# Patient Record
Sex: Female | Born: 1965 | Race: White | Hispanic: No | Marital: Single | Smoking: Never smoker
Health system: Southern US, Community
[De-identification: ages and names within clinical notes are randomized; demographics above are authoritative.]

## PROBLEM LIST (undated history)

## (undated) HISTORY — PX: TUBAL LIGATION: SHX77

## (undated) HISTORY — PX: FOOT ARTHROPLASTY: SHX1657

## (undated) HISTORY — PX: OTHER SURGICAL HISTORY: SHX169

---

## 2003-09-06 ENCOUNTER — Ambulatory Visit (HOSPITAL_COMMUNITY): Admission: RE | Admit: 2003-09-06 | Discharge: 2003-09-06 | Payer: Self-pay | Admitting: Internal Medicine

## 2003-10-14 ENCOUNTER — Ambulatory Visit (HOSPITAL_COMMUNITY): Admission: RE | Admit: 2003-10-14 | Discharge: 2003-10-14 | Payer: Self-pay | Admitting: Family Medicine

## 2003-10-29 ENCOUNTER — Emergency Department (HOSPITAL_COMMUNITY): Admission: EM | Admit: 2003-10-29 | Discharge: 2003-10-30 | Payer: Self-pay | Admitting: Emergency Medicine

## 2004-03-20 ENCOUNTER — Ambulatory Visit: Payer: Self-pay | Admitting: Family Medicine

## 2004-03-30 ENCOUNTER — Ambulatory Visit: Payer: Self-pay | Admitting: Family Medicine

## 2004-04-03 ENCOUNTER — Ambulatory Visit: Payer: Self-pay | Admitting: Family Medicine

## 2004-04-06 ENCOUNTER — Ambulatory Visit: Payer: Self-pay | Admitting: *Deleted

## 2004-04-09 ENCOUNTER — Encounter: Admission: RE | Admit: 2004-04-09 | Discharge: 2004-04-09 | Payer: Self-pay | Admitting: Family Medicine

## 2004-05-04 ENCOUNTER — Ambulatory Visit: Payer: Self-pay | Admitting: Family Medicine

## 2004-10-19 ENCOUNTER — Ambulatory Visit: Payer: Self-pay | Admitting: Family Medicine

## 2005-03-26 ENCOUNTER — Ambulatory Visit: Payer: Self-pay | Admitting: Nurse Practitioner

## 2005-04-05 ENCOUNTER — Ambulatory Visit: Payer: Self-pay | Admitting: Family Medicine

## 2005-04-05 LAB — CONVERTED CEMR LAB: Pap Smear: NORMAL

## 2005-07-19 ENCOUNTER — Ambulatory Visit: Payer: Self-pay | Admitting: Family Medicine

## 2005-08-24 ENCOUNTER — Ambulatory Visit: Payer: Self-pay | Admitting: Internal Medicine

## 2005-08-26 ENCOUNTER — Ambulatory Visit: Payer: Self-pay | Admitting: Family Medicine

## 2005-08-26 ENCOUNTER — Ambulatory Visit: Payer: Self-pay | Admitting: *Deleted

## 2005-09-13 ENCOUNTER — Emergency Department (HOSPITAL_COMMUNITY): Admission: EM | Admit: 2005-09-13 | Discharge: 2005-09-13 | Payer: Self-pay | Admitting: Emergency Medicine

## 2005-10-22 IMAGING — CT CT ABDOMEN W/O CM
1 series · 15 of 32 positions shown, 19 images · IV contrast (GASTRO)
Comparison: none

CLINICAL DATA: Left pelvic and low back pain.  Hematuria.  Amenorrhea, history of tubal ligations in 1111.    Said to have had enlarged gallbladder during pregnancy. 
 CT ABDOMEN WITHOUT CONTRAST
 A series of scans of the entire abdomen are made without IV but with oral contrast and shows oral contrast to have passed through the stomach and small bowel into the left colon without evidence of obstruction or perforation.  The gallbladder, liver, hepatic radicles, common bile duct, pancreas and spleen appear normal as far as can be seen.  The adrenal glands and the kidneys appear normal.  The upper ureters are normal.  No abnormal lymph nodes are seen.  The appendix appears retrocecal and within the normal limit.  
 No free fluid or air is seen within the abdomen.  The bones of the lower thoracic and upper lumbar spine appear normal.  
 IMPRESSION
 Normal CT scan of the abdomen with oral but no IV contrast.  
 CT PELVIS WITHOUT CONTRAST
 There is noted a large amount of fecal material within the elongated redundant sigmoid colon.  No evidence of diverticulitis or abscess is seen.  No inflammation is present.  No free fluid or air is seen within the pelvis.  The bladder is partially filled and appears normal as do the distal ureters, the uterus and ovaries appear normal.  There are metallic clips bilaterally consistent with the patient?s history of tubal ligation surgery.  The bones of the pelvis and lower lumbar spine appear normal.  
 Bilateral tubal ligations.  Large amounts of fecal material within the elongated and redundant sigmoid colon.  Uterus and ovaries appear normal.  No free fluid or air in the pelvis.

[Series 2: abd pelvis · axial · 0.58mm/px · z∈[-527,-132]mm · 15 of 88 slices shown, 19 images]
[im 6/88  soft-tissue]
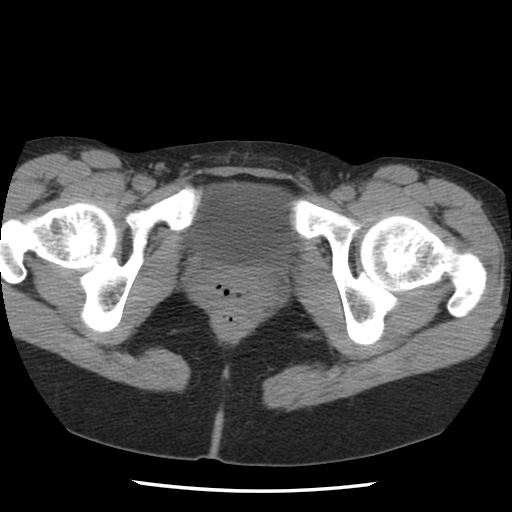
[im 6/88  bone]
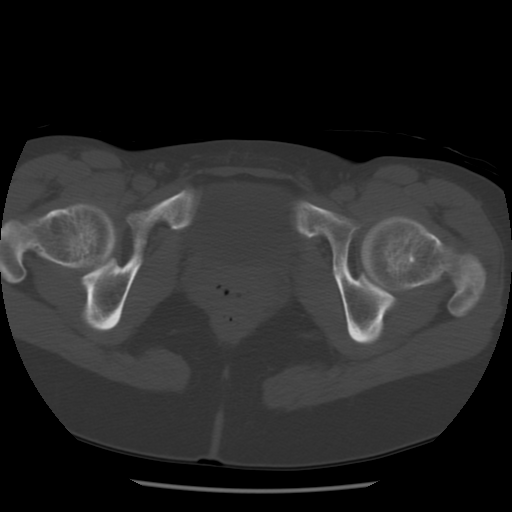
[im 12/88  soft-tissue]
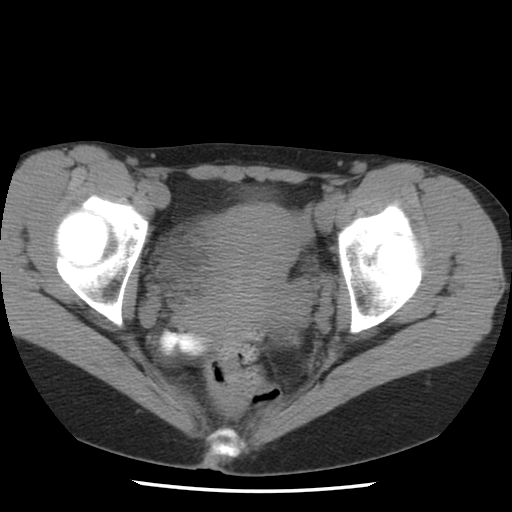
[im 17/88  soft-tissue]
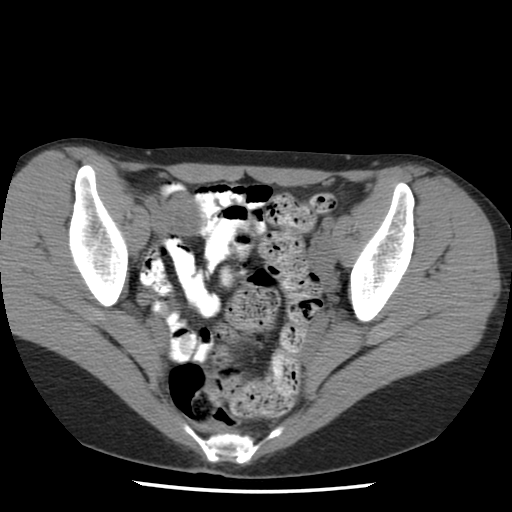
[im 26/88  soft-tissue]
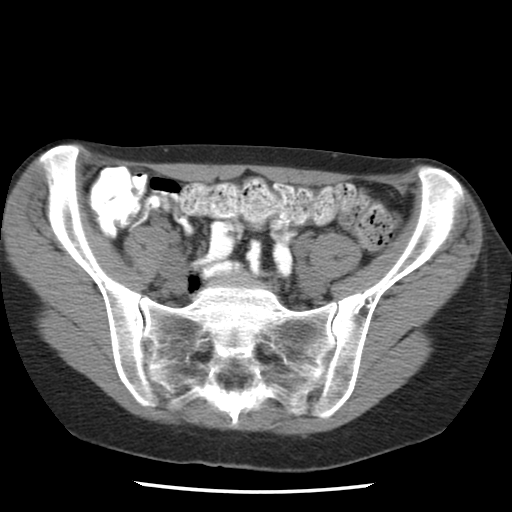
[im 31/88  soft-tissue]
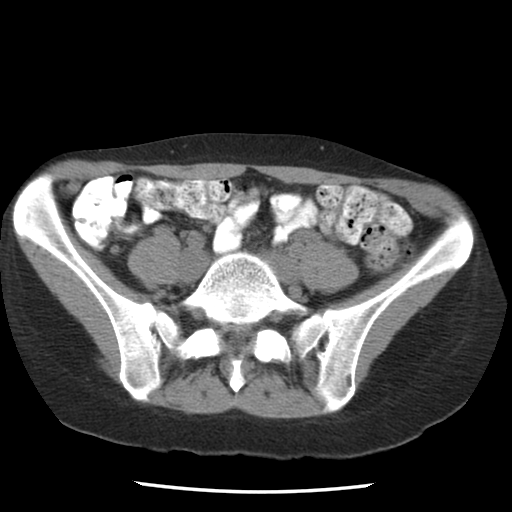
[im 37/88  soft-tissue]
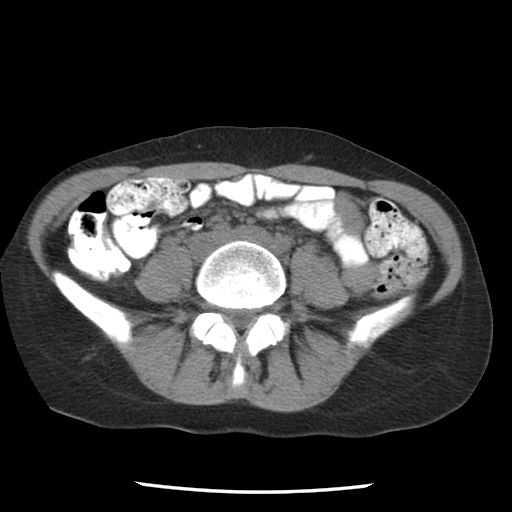
[im 45/88  soft-tissue]
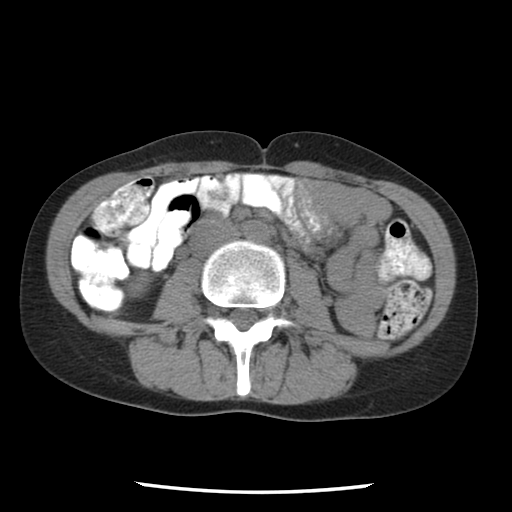
[im 51/88  soft-tissue]
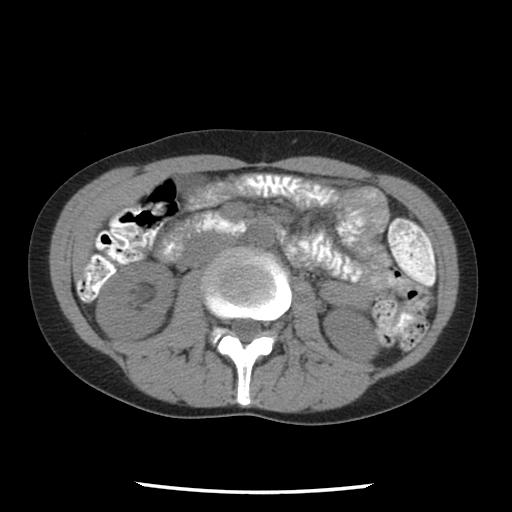
[im 57/88  soft-tissue]
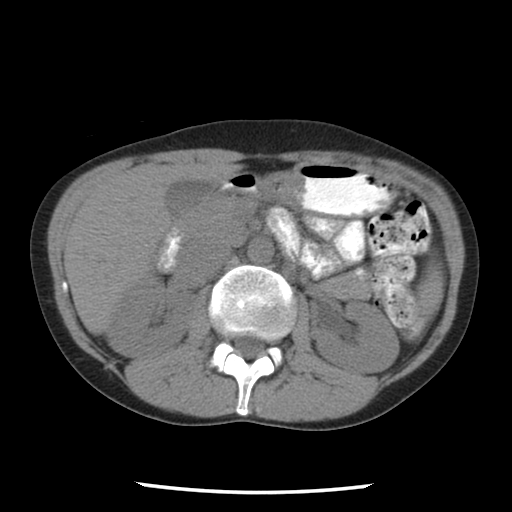
[im 57/88  bone]
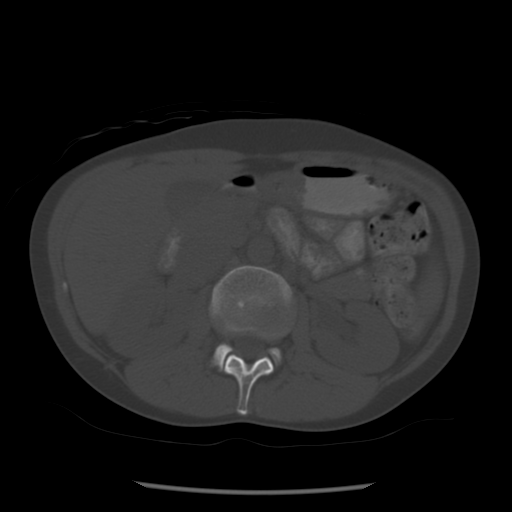
[im 62/88  soft-tissue]
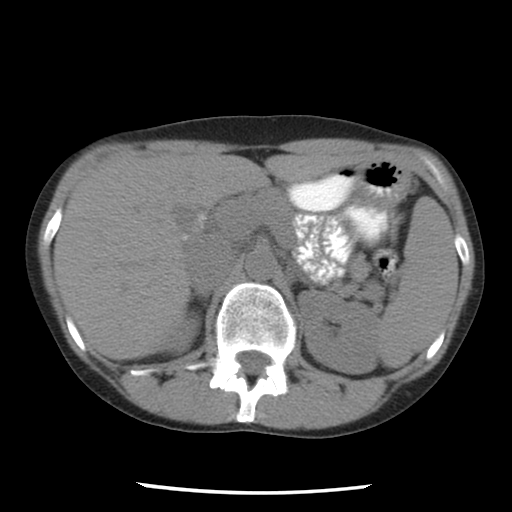
[im 71/88  soft-tissue]
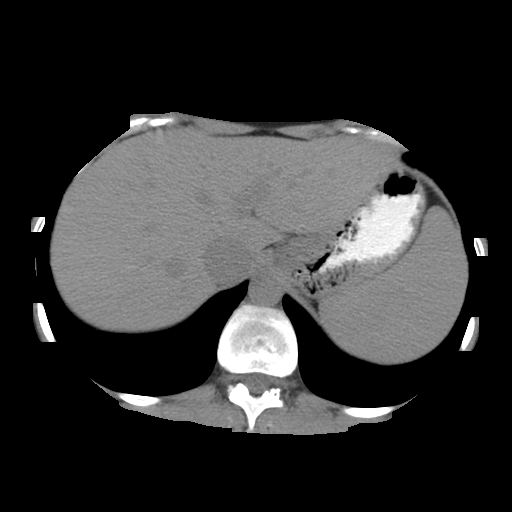
[im 76/88  soft-tissue]
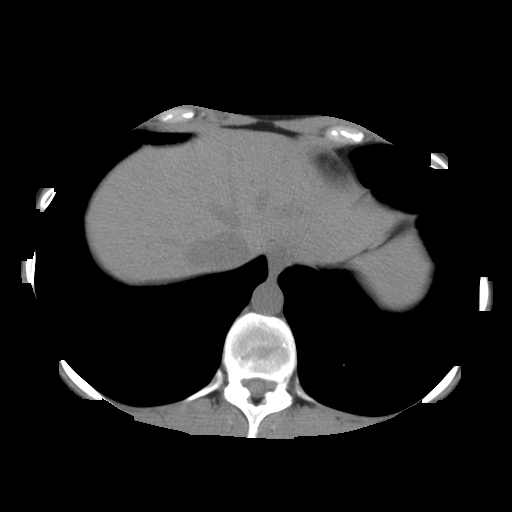
[im 76/88  lung]
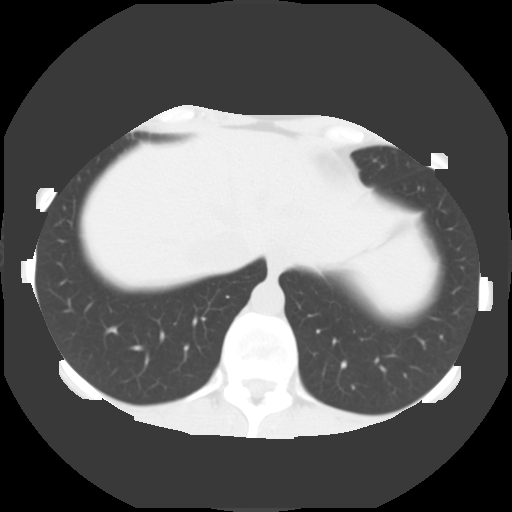
[im 79/88  lung]
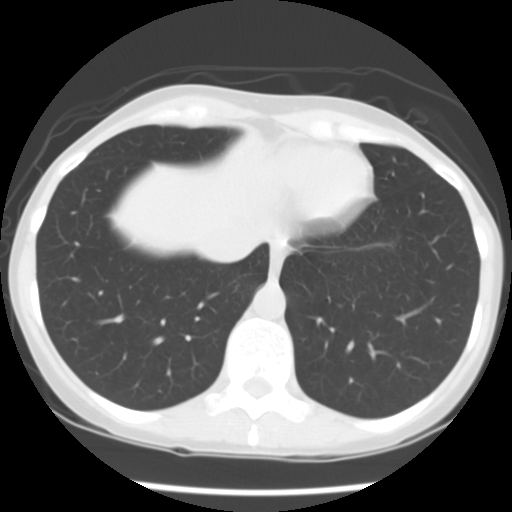
[im 82/88  soft-tissue]
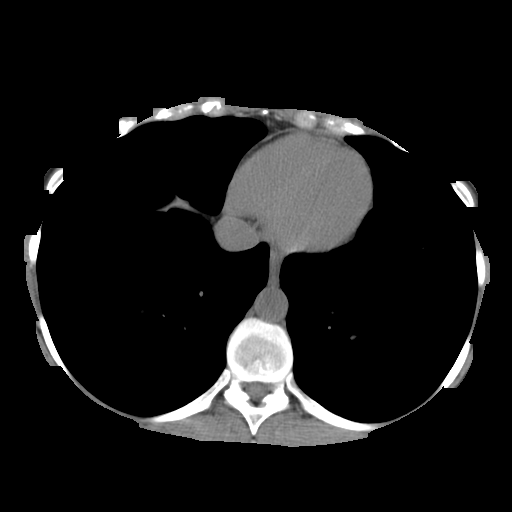
[im 82/88  lung]
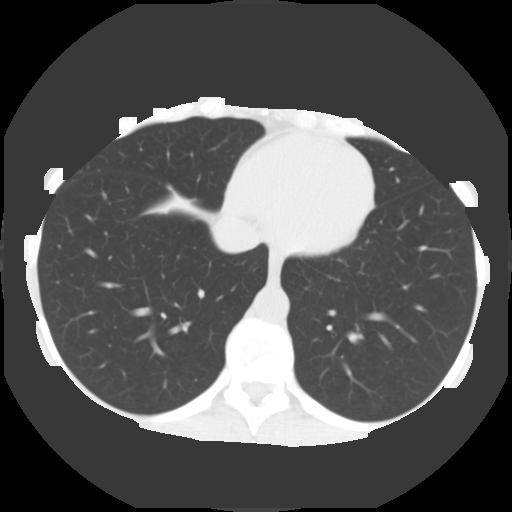
[im 85/88  lung]
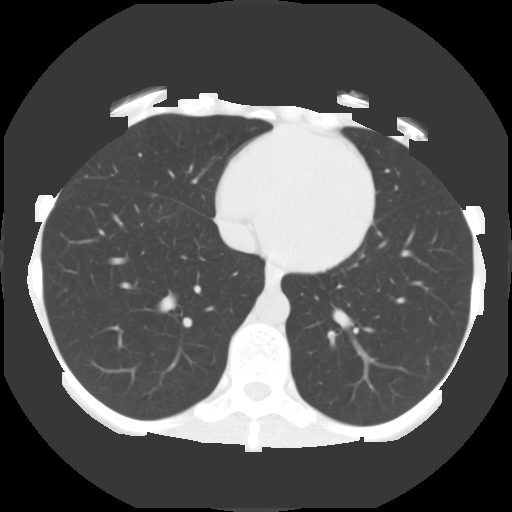

[15 of 32 positions shown; findings below may reference images not displayed]

## 2007-01-24 ENCOUNTER — Encounter (INDEPENDENT_AMBULATORY_CARE_PROVIDER_SITE_OTHER): Payer: Self-pay | Admitting: Family Medicine

## 2007-01-24 DIAGNOSIS — J309 Allergic rhinitis, unspecified: Secondary | ICD-10-CM | POA: Insufficient documentation

## 2007-01-24 DIAGNOSIS — H9209 Otalgia, unspecified ear: Secondary | ICD-10-CM | POA: Insufficient documentation

## 2014-04-30 ENCOUNTER — Encounter (HOSPITAL_COMMUNITY): Payer: Self-pay | Admitting: Emergency Medicine

## 2014-04-30 ENCOUNTER — Emergency Department (HOSPITAL_COMMUNITY)
Admission: EM | Admit: 2014-04-30 | Discharge: 2014-04-30 | Disposition: A | Discharge status: Home / Self Care | Payer: BLUE CROSS/BLUE SHIELD | Attending: Emergency Medicine | Admitting: Emergency Medicine

## 2014-04-30 DIAGNOSIS — K029 Dental caries, unspecified: Secondary | ICD-10-CM | POA: Insufficient documentation

## 2014-04-30 DIAGNOSIS — K088 Other specified disorders of teeth and supporting structures: Secondary | ICD-10-CM | POA: Insufficient documentation

## 2014-04-30 DIAGNOSIS — K0381 Cracked tooth: Secondary | ICD-10-CM | POA: Diagnosis not present

## 2014-04-30 DIAGNOSIS — K0889 Other specified disorders of teeth and supporting structures: Secondary | ICD-10-CM

## 2014-04-30 MED ORDER — HYDROCODONE-ACETAMINOPHEN 5-325 MG PO TABS: 1.0000 | ORAL_TABLET | ORAL | Status: DC | PRN

## 2014-04-30 MED ORDER — PENICILLIN V POTASSIUM 500 MG PO TABS: 500.0000 mg | ORAL_TABLET | Freq: Four times a day (QID) | ORAL | Status: AC

## 2014-04-30 NOTE — ED Notes (Signed)
Pt made aware to return if symptoms worsen or if any life threatening symptoms occur.   

## 2014-04-30 NOTE — ED Notes (Signed)
Pt c/o right upper dental pain with possible abscess

## 2014-04-30 NOTE — ED Provider Notes (Signed)
CSN: [id number]     Arrival date & time 04/30/14  1459 History  This chart was scribed for Deanna DredgeEmily Lachelle Rissler, PA-C working with Audree CamelScott T Goldston, MD by Evon Slackerrance Branch, ED Scribe. This patient was seen in room TR09C/TR09C and the patient's care was started at 3:59 PM.     Chief Complaint  Patient presents with  . Dental Pain    Patient is a 49 y.o. female presenting with tooth pain. The history is provided by the patient. No language interpreter was used.  Dental Pain Associated symptoms: facial swelling   Associated symptoms: no fever and no neck pain    HPI Comments: Deanna Marsh is a 49 y.o. female who presents to the Emergency Department complaining of new right upper dental pain onset 2 days prior. Pt states that the tooth chipped about 2 days prior. Pt states she has associated facial swelling. Pt states that the pain is radiating across the top pf her nose. Pt states she has tried ibuprofen that has slight relief to the facial swelling. Pt states she has is planning on following up with the dentist but states that she needs antibiotics. Denies fever, vision changes or other related symptoms.   History reviewed. No pertinent past medical history. History reviewed. No pertinent past surgical history. History reviewed. No pertinent family history. History  Substance Use Topics  . Smoking status: Never Smoker   . Smokeless tobacco: Not on file  . Alcohol Use: No   OB History    No data available     Review of Systems  Constitutional: Negative for fever and chills.  HENT: Positive for dental problem and facial swelling. Negative for sore throat and trouble swallowing.   Eyes: Negative for visual disturbance.  Respiratory: Negative for shortness of breath.   Musculoskeletal: Negative for neck pain and neck stiffness.  Skin: Negative for color change and wound.  Allergic/Immunologic: Negative for immunocompromised state.  Hematological: Does not bruise/bleed easily.   Psychiatric/Behavioral: Negative for self-injury.      Allergies  Betadine  Home Medications   Prior to Admission medications   Not on File   BP 113/69 mmHg  Pulse 63  Temp(Src) 98.2 F (36.8 C) (Oral)  Resp 18  SpO2 99%   Physical Exam  Constitutional: She appears well-developed and well-nourished. No distress.  HENT:  Head: Normocephalic and atraumatic.  Mouth/Throat: Uvula is midline and oropharynx is clear and moist. Mucous membranes are not dry. No uvula swelling. No oropharyngeal exudate, posterior oropharyngeal edema, posterior oropharyngeal erythema or tonsillar abscesses.  Various cavities and dental decay through out mouth, right upper 2nd bicuspid with remote fracture vs erosion, tender to percussion, slight right facial swelling, tender to palpation, no erythema no noted area of fluctuance or induration.   Neck: Trachea normal, normal range of motion and phonation normal. Neck supple. No tracheal tenderness present. No rigidity. No tracheal deviation, no edema, no erythema and normal range of motion present.  Cardiovascular: Normal rate.   Pulmonary/Chest: Effort normal and breath sounds normal. No stridor.  Lymphadenopathy:    She has no cervical adenopathy.  Neurological: She is alert.  Skin: She is not diaphoretic.  Nursing note and vitals reviewed.   ED Course  Procedures (including critical care time) DIAGNOSTIC STUDIES: Oxygen Saturation is 99% on RA, normal by my interpretation.    COORDINATION OF CARE: 4:06 PM-Discussed treatment plan  with pt at bedside and pt agreed to plan.     Labs Review Labs Reviewed - No data  to display  Imaging Review No results found.   EKG Interpretation None      MDM   Final diagnoses:  Pain, dental    Afebrile, nontoxic patient with new dental pain.  No obvious abscess though there is some mild right sided facial swelling.  No concerning findings on exam.  Doubt deep space head or neck infection.  Doubt  Ludwig's angina.  D/C home with antibiotic, pain medication and dental follow up.  Discussed findings, treatment, and follow up  with patient.  Pt given return precautions.  Pt verbalizes understanding and agrees with plan.        I personally performed the services described in this documentation, which was scribed in my presence. The recorded information has been reviewed and is accurate.      Deanna Dredge, PA-C 04/30/14 1639  Audree Camel, MD 05/09/14 [phone]

## 2014-04-30 NOTE — Discharge Instructions (Signed)
°  Read the information below.  Use the prescribed medication as directed.  Please discuss all new medications with your pharmacist.  Do not take additional tylenol while taking the prescribed pain medication to avoid overdose.  You may return to the Emergency Department at any time for worsening condition or any new symptoms that concern you.  If there is any possibility that you might be pregnant, please let your health care provider know and discuss this with the pharmacist to ensure medication safety.    Please call the dentist listed above within 48 hours to schedule a close follow up appointment.  If you develop fevers, swelling in your face, difficulty swallowing or breathing, return to the ER immediately for a recheck.    Dental Pain A tooth ache may be caused by cavities (tooth decay). Cavities expose the nerve of the tooth to air and hot or cold temperatures. It may come from an infection or abscess (also called a boil or furuncle) around your tooth. It is also often caused by dental caries (tooth decay). This causes the pain you are having. DIAGNOSIS  Your caregiver can diagnose this problem by exam. TREATMENT   If caused by an infection, it may be treated with medications which kill germs (antibiotics) and pain medications as prescribed by your caregiver. Take medications as directed.  Only take over-the-counter or prescription medicines for pain, discomfort, or fever as directed by your caregiver.  Whether the tooth ache today is caused by infection or dental disease, you should see your dentist as soon as possible for further care. SEEK MEDICAL CARE IF: The exam and treatment you received today has been provided on an emergency basis only. This is not a substitute for complete medical or dental care. If your problem worsens or new problems (symptoms) appear, and you are unable to meet with your dentist, call or return to this location. SEEK IMMEDIATE MEDICAL CARE IF:   You have a  fever.  You develop redness and swelling of your face, jaw, or neck.  You are unable to open your mouth.  You have severe pain uncontrolled by pain medicine. MAKE SURE YOU:   Understand these instructions.  Will watch your condition.  Will get help right away if you are not doing well or get worse. Document Released: 03/01/2005 Document Revised: 05/24/2011 Document Reviewed: 10/18/2007 [facility] Patient Information 2015 [place], [place]. This information is not intended to replace advice given to you by your health care provider. Make sure you discuss any questions you have with your health care provider.

## 2014-05-27 ENCOUNTER — Emergency Department (HOSPITAL_COMMUNITY)
Admission: EM | Admit: 2014-05-27 | Discharge: 2014-05-27 | Disposition: A | Discharge status: Home / Self Care | Payer: BLUE CROSS/BLUE SHIELD | Attending: Emergency Medicine | Admitting: Emergency Medicine

## 2014-05-27 ENCOUNTER — Encounter (HOSPITAL_COMMUNITY): Payer: Self-pay | Admitting: Emergency Medicine

## 2014-05-27 DIAGNOSIS — M545 Low back pain: Secondary | ICD-10-CM | POA: Insufficient documentation

## 2014-05-27 DIAGNOSIS — Z3202 Encounter for pregnancy test, result negative: Secondary | ICD-10-CM | POA: Insufficient documentation

## 2014-05-27 DIAGNOSIS — R339 Retention of urine, unspecified: Secondary | ICD-10-CM | POA: Diagnosis not present

## 2014-05-27 DIAGNOSIS — R300 Dysuria: Secondary | ICD-10-CM | POA: Diagnosis not present

## 2014-05-27 DIAGNOSIS — R197 Diarrhea, unspecified: Secondary | ICD-10-CM | POA: Diagnosis not present

## 2014-05-27 DIAGNOSIS — R6883 Chills (without fever): Secondary | ICD-10-CM | POA: Diagnosis not present

## 2014-05-27 DIAGNOSIS — R350 Frequency of micturition: Secondary | ICD-10-CM | POA: Diagnosis present

## 2014-05-27 LAB — URINALYSIS, ROUTINE W REFLEX MICROSCOPIC
BILIRUBIN URINE: NEGATIVE
GLUCOSE, UA: NEGATIVE mg/dL
HGB URINE DIPSTICK: NEGATIVE
Ketones, ur: NEGATIVE mg/dL
Nitrite: NEGATIVE
PH: 5 (ref 5.0–8.0)
Protein, ur: NEGATIVE mg/dL
SPECIFIC GRAVITY, URINE: 1.029 (ref 1.005–1.030)
Urobilinogen, UA: 0.2 mg/dL (ref 0.0–1.0)

## 2014-05-27 LAB — URINE MICROSCOPIC-ADD ON

## 2014-05-27 LAB — POC URINE PREG, ED: Preg Test, Ur: NEGATIVE

## 2014-05-27 MED ORDER — PHENAZOPYRIDINE HCL 200 MG PO TABS: 200.0000 mg | ORAL_TABLET | Freq: Three times a day (TID) | ORAL | Status: DC

## 2014-05-27 MED ORDER — ONDANSETRON HCL 4 MG PO TABS: 4.0000 mg | ORAL_TABLET | Freq: Three times a day (TID) | ORAL | Status: DC | PRN

## 2014-05-27 MED ORDER — SULFAMETHOXAZOLE-TRIMETHOPRIM 800-160 MG PO TABS: 1.0000 | ORAL_TABLET | Freq: Two times a day (BID) | ORAL | Status: AC

## 2014-05-27 NOTE — ED Notes (Signed)
Pt c/0 burning with urination x 2 days. Hx of urethral injury 21 years ago during labor and delivery. States has had intermittent UTI's through the years.

## 2014-05-27 NOTE — ED Notes (Signed)
Declined W/C at D/C and was escorted to lobby by RN. 

## 2014-05-27 NOTE — ED Notes (Signed)
Patient states urinary frequency with little yield.   Patient states has burning when she urinates.   Patient states low back pain.  Patient states has recurrently UTI's.

## 2014-05-27 NOTE — ED Provider Notes (Signed)
CSN: [id number]     Arrival date & time 05/27/14  1102 History  This chart was scribed for non-physician practitioner, Trixie Dredge, PA-C, working with Gerhard Munch, MD by Charline Bills, ED Scribe. This patient was seen in room TR05C/TR05C and the patient's care was started at 12:48 PM.   Chief Complaint  Patient presents with  . Urinary Frequency  . Back Pain   The history is provided by the patient. No language interpreter was used.   HPI Comments: Deanna Marsh is a 49 y.o. female who presents to the Emergency Department complaining of urinary frequency since yesterday. She reports associated urinary retention, dysuria, chills, lower back pain, nausea, diarrhea. She denies vomiting, vaginal bleeding, vaginal discharge, hematuria. Pt reports h/o similar symptoms with UTIs in the past. No h/o kidney stones. Pt has been drinking plenty of water and cranberry juice since yesterday.   History reviewed. No pertinent past medical history. History reviewed. No pertinent past surgical history. No family history on file. History  Substance Use Topics  . Smoking status: Never Smoker   . Smokeless tobacco: Not on file  . Alcohol Use: No   OB History    No data available     Review of Systems  Constitutional: Positive for chills.  Gastrointestinal: Positive for nausea and diarrhea. Negative for vomiting.  Genitourinary: Positive for dysuria and frequency. Negative for hematuria, vaginal bleeding and vaginal discharge.  Musculoskeletal: Positive for back pain.   Allergies  Betadine  Home Medications   Prior to Admission medications   Medication Sig Start Date End Date Taking? Authorizing Provider  HYDROcodone-acetaminophen (NORCO/VICODIN) 5-325 MG per tablet Take 1 tablet by mouth every 4 (four) hours as needed for moderate pain or severe pain. 04/30/14   Trixie Dredge, PA-C   BP 126/78 mmHg  Pulse 71  Temp(Src) 98.4 F (36.9 C) (Oral)  Resp 18  Ht  (1.702 m)  Wt 137 lb 5 oz  (62.285 kg)  BMI 21.50 kg/m2  SpO2 100% Physical Exam  Constitutional: She appears well-developed and well-nourished. No distress.  HENT:  Head: Normocephalic and atraumatic.  Neck: Neck supple.  Cardiovascular: Normal rate and regular rhythm.   Pulmonary/Chest: Effort normal and breath sounds normal. No respiratory distress. She has no wheezes. She has no rales.  Abdominal: Soft. She exhibits no distension. There is no tenderness. There is CVA tenderness (left). There is no rebound and no guarding.  Neurological: She is alert.  Skin: She is not diaphoretic.  Nursing note and vitals reviewed.  ED Course  Procedures (including critical care time) DIAGNOSTIC STUDIES: Oxygen Saturation is 100% on RA, normal by my interpretation.    COORDINATION OF CARE: 12:50 PM-Discussed treatment plan which includes UA with pt at bedside and pt agreed to plan.   Labs Review Labs Reviewed  URINALYSIS, ROUTINE W REFLEX MICROSCOPIC - Abnormal; Notable for the following:    Leukocytes, UA TRACE (*)    All other components within normal limits  URINE MICROSCOPIC-ADD ON - Abnormal; Notable for the following:    Squamous Epithelial / LPF FEW (*)    Bacteria, UA FEW (*)    All other components within normal limits  URINE CULTURE  POC URINE PREG, ED   Imaging Review No results found.   EKG Interpretation None      I discussed with this patient the possibility of a kidney stone.  She is very comfortable appearing and while this is a possibility I think it is a low likelihood.  Pt advised she should return for worsening symptoms.  Discussed strict return precautions.     MDM   Final diagnoses:  Dysuria    Afebrile, nontoxic patient with urinary symptoms c/w her prior UTIs.  She did have left CVA tenderness.  No abdominal tenderness.  UA without obvious infection, culture sent.  Pt treated with bactrim x 3 days given symptoms.  Discussed possibility of kidney stones with pt, she declined CT and  will return for worsening symptoms.    D/C home with bactrim, pyridium, zofran.  Discussed strict return precautions.  Discussed result, findings, treatment, and follow up  with patient.  Pt given return precautions.  Pt verbalizes understanding and agrees with plan.        I personally performed the services described in this documentation, which was scribed in my presence. The recorded information has been reviewed and is accurate.   Trixie Dredgemily Yacob Wilkerson, PA-C 05/27/14 1709  Gerhard Munchobert Lockwood, MD 05/28/14 [phone]

## 2014-05-27 NOTE — Discharge Instructions (Signed)
Read the information below.  Use the prescribed medication as directed.  Please discuss all new medications with your pharmacist.  You may return to the Emergency Department at any time for worsening condition or any new symptoms that concern you.  If you develop fevers, uncontrolled vomiting, worsening flank or abdominal pain, or you are unable to urinate, return to the ER immediately for a recheck.   Dysuria Dysuria is the medical term for pain with urination. There are many causes for dysuria, but urinary tract infection is the most common. If a urinalysis was performed it can show that there is a urinary tract infection. A urine culture confirms that you or your child is sick. You will need to follow up with a healthcare provider because:  If a urine culture was done you will need to know the culture results and treatment recommendations.  If the urine culture was positive, you or your child will need to be put on antibiotics or know if the antibiotics prescribed are the right antibiotics for your urinary tract infection.  If the urine culture is negative (no urinary tract infection), then other causes may need to be explored or antibiotics need to be stopped. Today laboratory work may have been done and there does not seem to be an infection. If cultures were done they will take at least 24 to 48 hours to be completed. Today x-rays may have been taken and they read as normal. No cause can be found for the problems. The x-rays may be re-read by a radiologist and you will be contacted if additional findings are made. You or your child may have been put on medications to help with this problem until you can see your primary caregiver. If the problems get better, see your primary caregiver if the problems return. If you were given antibiotics (medications which kill germs), take all of the mediations as directed for the full course of treatment.  If laboratory work was done, you need to find the  results. Leave a telephone number where you can be reached. If this is not possible, make sure you find out how you are to get test results. HOME CARE INSTRUCTIONS   Drink lots of fluids. For adults, drink eight, 8 ounce glasses of clear juice or water a day. For children, replace fluids as suggested by your caregiver.  Empty the bladder often. Avoid holding urine for long periods of time.  After a bowel movement, women should cleanse front to back, using each tissue only once.  Empty your bladder before and after sexual intercourse.  Take all the medicine given to you until it is gone. You may feel better in a few days, but TAKE ALL MEDICINE.  Avoid caffeine, tea, alcohol and carbonated beverages, because they tend to irritate the bladder.  In men, alcohol may irritate the prostate.  Only take over-the-counter or prescription medicines for pain, discomfort, or fever as directed by your caregiver.  If your caregiver has given you a follow-up appointment, it is very important to keep that appointment. Not keeping the appointment could result in a chronic or permanent injury, pain, and disability. If there is any problem keeping the appointment, you must call back to this facility for assistance. SEEK IMMEDIATE MEDICAL CARE IF:   Back pain develops.  A fever develops.  There is nausea (feeling sick to your stomach) or vomiting (throwing up).  Problems are no better with medications or are getting worse. MAKE SURE YOU:   Understand these  instructions.  Will watch your condition.  Will get help right away if you are not doing well or get worse. Document Released: 11/28/2003 Document Revised: 05/24/2011 Document Reviewed: 10/05/2007 [facility]iteExitCare Patient Information 2015 [place], [place]. This information is not intended to replace advice given to you by your health care provider. Make sure you discuss any questions you have with your health care provider.

## 2014-05-28 LAB — URINE CULTURE
COLONY COUNT: NO GROWTH
Culture: NO GROWTH

## 2014-07-13 ENCOUNTER — Emergency Department (HOSPITAL_COMMUNITY): Payer: BLUE CROSS/BLUE SHIELD

## 2014-07-13 ENCOUNTER — Encounter (HOSPITAL_COMMUNITY): Payer: Self-pay | Admitting: *Deleted

## 2014-07-13 ENCOUNTER — Emergency Department (HOSPITAL_COMMUNITY)
Admission: EM | Admit: 2014-07-13 | Discharge: 2014-07-13 | Disposition: A | Discharge status: Home / Self Care | Payer: BLUE CROSS/BLUE SHIELD | Attending: Emergency Medicine | Admitting: Emergency Medicine

## 2014-07-13 DIAGNOSIS — Z79899 Other long term (current) drug therapy: Secondary | ICD-10-CM | POA: Insufficient documentation

## 2014-07-13 DIAGNOSIS — M7989 Other specified soft tissue disorders: Secondary | ICD-10-CM

## 2014-07-13 DIAGNOSIS — M7981 Nontraumatic hematoma of soft tissue: Secondary | ICD-10-CM | POA: Insufficient documentation

## 2014-07-13 DIAGNOSIS — Z3202 Encounter for pregnancy test, result negative: Secondary | ICD-10-CM | POA: Insufficient documentation

## 2014-07-13 LAB — CBC WITH DIFFERENTIAL/PLATELET
BASOS ABS: 0 10*3/uL (ref 0.0–0.1)
BASOS PCT: 1 % (ref 0–1)
EOS ABS: 0.1 10*3/uL (ref 0.0–0.7)
Eosinophils Relative: 2 % (ref 0–5)
HEMATOCRIT: 34.7 % — AB (ref 36.0–46.0)
Hemoglobin: 11.1 g/dL — ABNORMAL LOW (ref 12.0–15.0)
Lymphocytes Relative: 21 % (ref 12–46)
Lymphs Abs: 1.5 10*3/uL (ref 0.7–4.0)
MCH: 28.5 pg (ref 26.0–34.0)
MCHC: 32 g/dL (ref 30.0–36.0)
MCV: 89.2 fL (ref 78.0–100.0)
MONO ABS: 0.7 10*3/uL (ref 0.1–1.0)
Monocytes Relative: 9 % (ref 3–12)
Neutro Abs: 5 10*3/uL (ref 1.7–7.7)
Neutrophils Relative %: 69 % (ref 43–77)
PLATELETS: 224 10*3/uL (ref 150–400)
RBC: 3.89 MIL/uL (ref 3.87–5.11)
RDW: 15.1 % (ref 11.5–15.5)
WBC: 7.3 10*3/uL (ref 4.0–10.5)

## 2014-07-13 LAB — BASIC METABOLIC PANEL
Anion gap: 7 (ref 5–15)
BUN: 8 mg/dL (ref 6–23)
CHLORIDE: 104 mmol/L (ref 96–112)
CO2: 28 mmol/L (ref 19–32)
CREATININE: 0.95 mg/dL (ref 0.50–1.10)
Calcium: 8.7 mg/dL (ref 8.4–10.5)
GFR calc Af Amer: 82 mL/min — ABNORMAL LOW (ref >90)
GFR calc non Af Amer: 71 mL/min — ABNORMAL LOW (ref >90)
Glucose, Bld: 96 mg/dL (ref 70–99)
Potassium: 3.6 mmol/L (ref 3.5–5.1)
SODIUM: 139 mmol/L (ref 135–145)

## 2014-07-13 LAB — URINALYSIS, ROUTINE W REFLEX MICROSCOPIC
Bilirubin Urine: NEGATIVE
Glucose, UA: NEGATIVE mg/dL
Ketones, ur: NEGATIVE mg/dL
NITRITE: NEGATIVE
Protein, ur: NEGATIVE mg/dL
SPECIFIC GRAVITY, URINE: 1.016 (ref 1.005–1.030)
Urobilinogen, UA: 0.2 mg/dL (ref 0.0–1.0)
pH: 8 (ref 5.0–8.0)

## 2014-07-13 LAB — URINE MICROSCOPIC-ADD ON

## 2014-07-13 LAB — PREGNANCY, URINE: PREG TEST UR: NEGATIVE

## 2014-07-13 MED ORDER — FUROSEMIDE 20 MG PO TABS: 20.0000 mg | ORAL_TABLET | Freq: Every day | ORAL | Status: DC

## 2014-07-13 MED ORDER — FUROSEMIDE 20 MG PO TABS
20.0000 mg | ORAL_TABLET | Freq: Once | ORAL | Status: AC
  Administered 2014-07-13: 20 mg via ORAL
  Filled 2014-07-13: qty 1

## 2014-07-13 NOTE — Discharge Instructions (Signed)
Peripheral Edema You have swelling in your legs (peripheral edema). This swelling is due to excess accumulation of salt and water in your body. Edema may be a sign of heart, kidney or liver disease, or a side effect of a medication. It may also be due to problems in the leg veins. Elevating your legs and using special support stockings may be very helpful, if the cause of the swelling is due to poor venous circulation. Avoid long periods of standing, whatever the cause. Treatment of edema depends on identifying the cause. Chips, pretzels, pickles and other salty foods should be avoided. Restricting salt in your diet is almost always needed. Water pills (diuretics) are often used to remove the excess salt and water from your body via urine. These medicines prevent the kidney from reabsorbing sodium. This increases urine flow. Diuretic treatment may also result in lowering of potassium levels in your body. Potassium supplements may be needed if you have to use diuretics daily. Daily weights can help you keep track of your progress in clearing your edema. You should call your caregiver for follow up care as recommended. SEEK IMMEDIATE MEDICAL CARE IF:   You have increased swelling, pain, redness, or heat in your legs.  You develop shortness of breath, especially when lying down.  You develop chest or abdominal pain, weakness, or fainting.  You have a fever. Document Released: 04/08/2004 Document Revised: 05/24/2011 Document Reviewed: 03/19/2009 [facility]ExitCare Patient Information 2015 [place]Exit[place], [place]. This information is not intended to replace advice given to you by your health care provider. Make sure you discuss any questions you have with your health care provider.  You were evaluated in the ED today for your lower leg swelling. There does not appear to be an emergent cause for your symptoms at this time. Please take your diuretic medication as directed to help with her leg swelling. It is important to  follow up with your doctor and cardiology for further evaluation and management of your symptoms. Return to ED for new or worsening symptoms.

## 2014-07-13 NOTE — ED Provider Notes (Signed)
CSN: [id number]     Arrival date & time 07/13/14  1125 History   First MD Initiated Contact with Patient 07/13/14 1240     Chief Complaint  Patient presents with  . Leg Swelling     (Consider location/radiation/quality/duration/timing/severity/associated sxs/prior Treatment) HPI Deanna Marsh is a 49 y.o. female who comes in for evaluation of leg swelling. Patient states that she has had lower leg swelling in the past secondary to sitting at a desk. She reports being prescribed Lasix by the doctor she worked for him, a Therapist, sportspsychiatrist. She no longer works at [employer] desk and has not had leg swelling in the past 6 months that has discontinued taking the Lasix. She reports 3 days ago she has had an increase in her leg swelling. She denies fevers, chest pain, shortness of breath, recent travel or surgery, unilateral leg swelling, hemoptysis, exogenous estrogen, history of blood clot. Reports that she also bruises very easily and this has been throughout her entire life. Reports that her legs have multiple bruises all over them because she has been rubbing them. Leg swelling has decreased since yesterday which she attributes to resting and elevating her legs last night. Patient reports that she has a very active person who works out every day. No other aggravating or modifying factors.  History reviewed. No pertinent past medical history. History reviewed. No pertinent past surgical history. No family history on file. History  Substance Use Topics  . Smoking status: Never Smoker   . Smokeless tobacco: Not on file  . Alcohol Use: No   OB History    No data available     Review of Systems A 10 point review of systems was completed and was negative except for pertinent positives and negatives as mentioned in the history of present illness     Allergies  Betadine  Home Medications   Prior to Admission medications   Medication Sig Start Date End Date Taking? Authorizing Provider  ibuprofen  (ADVIL,MOTRIN) 200 MG tablet Take 400 mg by mouth every 6 (six) hours as needed.   Yes Historical Provider, MD  furosemide (LASIX) 20 MG tablet Take 1 tablet (20 mg total) by mouth daily. 07/13/14   Joycie PeekBenjamin Donnie Panik, PA-C  HYDROcodone-acetaminophen (NORCO/VICODIN) 5-325 MG per tablet Take 1 tablet by mouth every 4 (four) hours as needed for moderate pain or severe pain. Patient not taking: Reported on 07/13/2014 04/30/14   Trixie DredgeEmily West, PA-C  ondansetron (ZOFRAN) 4 MG tablet Take 1 tablet (4 mg total) by mouth every 8 (eight) hours as needed for nausea or vomiting. Patient not taking: Reported on 07/13/2014 05/27/14   Trixie DredgeEmily West, PA-C  phenazopyridine (PYRIDIUM) 200 MG tablet Take 1 tablet (200 mg total) by mouth 3 (three) times daily. Patient not taking: Reported on 07/13/2014 05/27/14   Trixie DredgeEmily West, PA-C   BP 126/77 mmHg  Pulse 52  Temp(Src) 97.7 F (36.5 C) (Oral)  Resp 16  SpO2 100%  LMP 07/10/2014 Physical Exam  Constitutional: She is oriented to person, place, and time. She appears well-developed and well-nourished.  HENT:  Head: Normocephalic and atraumatic.  Mouth/Throat: Oropharynx is clear and moist.  Eyes: Conjunctivae are normal. Pupils are equal, round, and reactive to light. Right eye exhibits no discharge. Left eye exhibits no discharge. No scleral icterus.  Neck: Normal range of motion. Neck supple. No JVD present.  Cardiovascular: Normal rate, regular rhythm and normal heart sounds.  Exam reveals no gallop and no friction rub.   No murmur heard. Pulmonary/Chest: Effort  normal and breath sounds normal. No respiratory distress. She has no wheezes. She has no rales.  Abdominal: Soft. There is no tenderness.  Musculoskeletal: She exhibits no tenderness.  1+ pitting edema bilaterally to the knee. Multiple ecchymosis. Distal pulses intact. Muscle compartments soft. Full active range of motion. No tenderness along the venous system. No erythema or other discolorations.  Neurological: She  is alert and oriented to person, place, and time.  Cranial Nerves II-XII grossly intact  Skin: Skin is warm and dry. No rash noted.  Psychiatric: She has a normal mood and affect.  Nursing note and vitals reviewed.   ED Course  Procedures (including critical care time) Labs Review Labs Reviewed  BASIC METABOLIC PANEL - Abnormal; Notable for the following:    GFR calc non Af Amer 71 (*)    GFR calc Af Amer 82 (*)    All other components within normal limits  CBC WITH DIFFERENTIAL/PLATELET - Abnormal; Notable for the following:    Hemoglobin 11.1 (*)    HCT 34.7 (*)    All other components within normal limits  URINALYSIS, ROUTINE W REFLEX MICROSCOPIC - Abnormal; Notable for the following:    Hgb urine dipstick LARGE (*)    Leukocytes, UA SMALL (*)    All other components within normal limits  URINE MICROSCOPIC-ADD ON - Abnormal; Notable for the following:    Squamous Epithelial / LPF MANY (*)    All other components within normal limits  PREGNANCY, URINE    Imaging Review Dg Chest 2 View  07/13/2014   CLINICAL DATA:  Lower extremity edema  EXAM: CHEST  2 VIEW  COMPARISON:  None.  FINDINGS: There is no edema or consolidation. Heart size and pulmonary vascularity are normal. No adenopathy. No bone lesions.  IMPRESSION: No edema or consolidation.   Electronically Signed   By: Bretta Bang III M.D.   On: 07/13/2014 14:40     EKG Interpretation None      ED ECG REPORT   Date: 07/13/2014  Rate: 50  Rhythm: normal sinus rhythm  QRS Axis: normal  Intervals: normal  ST/T Wave abnormalities: normal  Conduction Disutrbances:none  Narrative Interpretation:   Old EKG Reviewed: none available  I have personally reviewed the EKG tracing and agree with the computerized printout as noted.  Meds given in ED:  Medications  furosemide (LASIX) tablet 20 mg (20 mg Oral Given 07/13/14 1518)    New Prescriptions   FUROSEMIDE (LASIX) 20 MG TABLET    Take 1 tablet (20 mg total) by  mouth daily.   Filed Vitals:   07/13/14 1415 07/13/14 1430 07/13/14 1445 07/13/14 1515  BP: 128/66 123/80 127/76 126/77  Pulse: 51 56 67 52  Temp:      TempSrc:      Resp:      SpO2: 100% 100% 100% 100%    MDM  Vitals stable - WNL -afebrile Pt resting comfortably in ED. PE--1+ pedal edema bilaterally to knee. Labwork--labs noncontributory. EKG is reassuring. Hemoglobin on urinalysis consistent with patient's current menses. Imaging-chest x-ray shows no acute cardio pulmonary pathology.  DDX--patient with bilateral lower extremity edema without any evidence of heart failure, DVT, PE, compartment syndrome. Patient is PERC negative. Overall very healthy individual. Given oral Lasix in the ED, we'll discharge with same and have patient follow-up with PCP/cardiology. I discussed all relevant lab findings and imaging results with pt and they verbalized understanding. Discussed f/u with PCP within 48 hrs and return precautions, pt very amenable  to plan.  Final diagnoses:  Leg swelling        Joycie Peek, PA-C 07/13/14 1622  Benjiman Core, MD 07/14/14 [phone]

## 2014-07-13 NOTE — ED Notes (Signed)
Patient denies headache at present time. States presents to the ED with increased bilateral lower leg swelling. Non-pitting edema noted to bilateral calves and mild edema to left foot. Patient denies numbness but endorses mild tingling more pronounced in left foot. Patient states swelling started three days ago and has progressed. Patient has had bilateral leg pain worse with standing and movement and better with rest and elevation. Patient sts had PRN Rx for HCTZ in the past but has not taken in over 6 months. Patient sts legs have never been this swollen before but sts that it has improved today compared to last night, she believes this is due to resting and elevation. Multiple bruises noted to bilateral calves- patient sts she did not injure herself but states it is from rubbing her legs- that she has been bruising more easily. Patient denies chest pain, shortness of breath, orthopnea.

## 2014-07-13 NOTE — ED Notes (Signed)
Pt states that she has had a dull headache for 2 days. Pt reports swelling in bilateral legs as well.

## 2014-07-21 ENCOUNTER — Emergency Department (HOSPITAL_COMMUNITY)
Admission: EM | Admit: 2014-07-21 | Discharge: 2014-07-22 | Disposition: A | Discharge status: Home / Self Care | Payer: BLUE CROSS/BLUE SHIELD | Attending: Emergency Medicine | Admitting: Emergency Medicine

## 2014-07-21 ENCOUNTER — Emergency Department (HOSPITAL_COMMUNITY)
Admission: EM | Admit: 2014-07-21 | Discharge: 2014-07-21 | Disposition: A | Discharge status: Home / Self Care | Payer: BLUE CROSS/BLUE SHIELD | Attending: Emergency Medicine | Admitting: Emergency Medicine

## 2014-07-21 ENCOUNTER — Encounter (HOSPITAL_COMMUNITY): Payer: Self-pay | Admitting: Emergency Medicine

## 2014-07-21 DIAGNOSIS — K0889 Other specified disorders of teeth and supporting structures: Secondary | ICD-10-CM

## 2014-07-21 DIAGNOSIS — K029 Dental caries, unspecified: Secondary | ICD-10-CM | POA: Insufficient documentation

## 2014-07-21 DIAGNOSIS — Z79899 Other long term (current) drug therapy: Secondary | ICD-10-CM | POA: Insufficient documentation

## 2014-07-21 DIAGNOSIS — R1110 Vomiting, unspecified: Secondary | ICD-10-CM | POA: Diagnosis not present

## 2014-07-21 DIAGNOSIS — K088 Other specified disorders of teeth and supporting structures: Secondary | ICD-10-CM | POA: Insufficient documentation

## 2014-07-21 MED ORDER — ONDANSETRON 4 MG PO TBDP
8.0000 mg | ORAL_TABLET | Freq: Once | ORAL | Status: AC
  Administered 2014-07-21: 8 mg via ORAL

## 2014-07-21 MED ORDER — BUPIVACAINE HCL (PF) 0.5 % IJ SOLN
5.0000 mL | Freq: Once | INTRAMUSCULAR | Status: AC
  Administered 2014-07-21: 5 mL
  Filled 2014-07-21: qty 10

## 2014-07-21 MED ORDER — ACETAMINOPHEN 325 MG PO TABS
ORAL_TABLET | ORAL | Status: AC
  Administered 2014-07-21: 650 mg
  Filled 2014-07-21: qty 2

## 2014-07-21 MED ORDER — KETOROLAC TROMETHAMINE 60 MG/2ML IM SOLN
60.0000 mg | Freq: Once | INTRAMUSCULAR | Status: AC
  Administered 2014-07-22: 60 mg via INTRAMUSCULAR
  Filled 2014-07-21: qty 2

## 2014-07-21 MED ORDER — MORPHINE SULFATE 4 MG/ML IJ SOLN
6.0000 mg | Freq: Once | INTRAMUSCULAR | Status: AC
  Administered 2014-07-22: 6 mg via INTRAMUSCULAR
  Filled 2014-07-21: qty 2

## 2014-07-21 MED ORDER — ACETAMINOPHEN 325 MG PO TABS
650.0000 mg | ORAL_TABLET | Freq: Once | ORAL | Status: AC
  Administered 2014-07-21: 650 mg via ORAL
  Filled 2014-07-21: qty 2

## 2014-07-21 MED ORDER — ONDANSETRON 4 MG PO TBDP
4.0000 mg | ORAL_TABLET | Freq: Once | ORAL | Status: AC
  Administered 2014-07-21: 4 mg via ORAL
  Filled 2014-07-21: qty 1

## 2014-07-21 MED ORDER — ACETAMINOPHEN 325 MG PO TABS
325.0000 mg | ORAL_TABLET | Freq: Once | ORAL | Status: AC
  Administered 2014-07-21: 650 mg via ORAL

## 2014-07-21 MED ORDER — OXYCODONE HCL 5 MG PO TABS
10.0000 mg | ORAL_TABLET | Freq: Once | ORAL | Status: AC
  Administered 2014-07-21: 10 mg via ORAL
  Filled 2014-07-21: qty 2

## 2014-07-21 MED ORDER — ONDANSETRON 4 MG PO TBDP
ORAL_TABLET | ORAL | Status: AC
  Filled 2014-07-21: qty 2

## 2014-07-21 NOTE — ED Notes (Signed)
Pt. reports persistent right upper molar pain onset this morning , seen here this afternoon received oral pain medication and local anesthetic with temporary relief.

## 2014-07-21 NOTE — Discharge Instructions (Signed)
Dental Pain °A tooth ache may be caused by cavities (tooth decay). Cavities expose the nerve of the tooth to air and hot or cold temperatures. It may come from an infection or abscess (also called a boil or furuncle) around your tooth. It is also often caused by dental caries (tooth decay). This causes the pain you are having. °DIAGNOSIS  °Your caregiver can diagnose this problem by exam. °TREATMENT  °· If caused by an infection, it may be treated with medications which kill germs (antibiotics) and pain medications as prescribed by your caregiver. Take medications as directed. °· Only take over-the-counter or prescription medicines for pain, discomfort, or fever as directed by your caregiver. °· Whether the tooth ache today is caused by infection or dental disease, you should see your dentist as soon as possible for further care. °SEEK MEDICAL CARE IF: °The exam and treatment you received today has been provided on an emergency basis only. This is not a substitute for complete medical or dental care. If your problem worsens or new problems (symptoms) appear, and you are unable to meet with your dentist, call or return to this location. °SEEK IMMEDIATE MEDICAL CARE IF:  °· You have a fever. °· You develop redness and swelling of your face, jaw, or neck. °· You are unable to open your mouth. °· You have severe pain uncontrolled by pain medicine. °MAKE SURE YOU:  °· Understand these instructions. °· Will watch your condition. °· Will get help right away if you are not doing well or get worse. °Document Released: 03/01/2005 Document Revised: 05/24/2011 Document Reviewed: 10/18/2007 °ExitCare® Patient Information ©2015 [employer]. This information is not intended to replace advice given to you by your health care provider. Make sure you discuss any questions you have with your health care provider. ° °Emergency Department Resource Guide °1) Find a Doctor and Pay Out of Pocket °Although you won't have to find out who  is covered by your insurance plan, it is a good idea to ask around and get recommendations. You will then need to call the office and see if the doctor you have chosen will accept you as a new patient and what types of options they offer for patients who are self-pay. Some doctors offer discounts or will set up payment plans for their patients who do not have insurance, but you will need to ask so you aren't surprised when you get to your appointment. ° °2) Contact Your Local Health Department °Not all health departments have doctors that can see patients for sick visits, but many do, so it is worth a call to see if yours does. If you don't know where your local health department is, you can check in your phone book. The CDC also has a tool to help you locate your state's health department, and many state websites also have listings of all of their local health departments. ° °3) Find a Walk-in Clinic °If your illness is not likely to be very severe or complicated, you may want to try a walk in clinic. These are popping up all over the country in pharmacies, drugstores, and shopping centers. They're usually staffed by nurse practitioners or physician assistants that have been trained to treat common illnesses and complaints. They're usually fairly quick and inexpensive. However, if you have serious medical issues or chronic medical problems, these are probably not your best option. ° °No Primary Care Doctor: °- Call [facility] at  832-8000 - they can help you locate a primary   care doctor that  accepts your insurance, provides certain services, etc. °- Physician Referral Service- 1-800-533-3463 ° °Chronic Pain Problems: °Organization         Address  Phone   Notes  °[facility]  (336) 297-2271 Patients need to be referred by their primary care doctor.  ° °Medication Assistance: °Organization         Address  Phone   Notes  °[place] [facility] [address]  [address] °[place], [place] [address] (336) 641-8030 --Must be a resident of [place] °-- Must have NO insurance coverage whatsoever (no Medicaid/ Medicare, etc.) °-- The pt. MUST have a primary care doctor that directs their care regularly and follows them in the community °  °[facility]  (866) 331-1348   °[employer]  (888) 892-1162   ° °Agencies that provide inexpensive medical care: °Organization         Address  Phone   Notes  °[facility]  (336) 832-8035   °[facility]    (336) 832-7272   °[facility] [address] °[place], [place] [address] (336) 832-4777   °[facility] [address], °[place] (336) 271-4999   °[facility]    (336) 373-0678   °[facility]    (336) 272-1050   °[facility] ° [address], [place] Phone:  (336) 832-4444, Fax:  (336) 832-4440 Hours of Operation:  9 am - 6 pm, M-F.  Also accepts Medicaid/Medicare and self-pay.  °[facility] ° [address], [place] Phone: (336) 832-3150, Fax: (336) [phone]. Hours of Operation:  8:30 am - 5:30 pm, M-F.  Also accepts Medicaid and self-pay.  °[facility] [place] [address], [place] Phone: (336) 878-6027   °[facility] [address], [place], [place] (336)723-1848, Ext. 123 Mondays & Thursdays: 7-9 AM.  First 15 patients are seen on a first come, first serve basis. °  ° °Medicaid-accepting [place] Providers: ° °Organization         Address  Phone   Notes  °[facility] [address], [place] (336) 641-2100 Also accepts self-pay patients.  °[facility] [address], [place] ° (336) 856-9996   °[facility] [address], [place] (336) 288-8857   °[facility] [address] [place] [address], [place] (336) 299-7000   °Veita Bland [address], [place]  ° (336) 373-1557 Only accepts [place] Access Medicaid patients after they have their name applied to their card.  ° °Self-Pay (no insurance) in [place]: ° °Organization         Address  Phone   Notes  °Sickle Cell Patients, [facility] [address], [place] (336) 832-1970   °[facility] [address], [place] (336) 832-4400   °[facility] ° [address], [place] (336) 992-4800   °[facility]/Dr. Osei-Bonsu ° [address] [place] [address], [place] or [address], [place] (336) [phone] Phone number for both [place] and [place] locations is the same.  °[facility] [address], [place] (336) 299-0000   °[facility] [address] [place] [address],  or [address] (336) 852-7530 °(336) 878-2260   °  [facility] [address], [place] (336) 350-1642, phone; (336) 294-5005, fax Sees patients 1st and 3rd Saturday of every month.  Must not qualify for public or private insurance (i.e. Medicaid, Medicare, [place] Health Choice, Veterans' Benefits) • Household income should be no more than 200% of the poverty level •The clinic cannot treat you if you are pregnant or think you are pregnant • Sexually transmitted diseases are not treated at the clinic.  ° ° °Dental Care: °Organization         Address  Phone  Notes  °[facility] [address], [place] (336) 641-6152 Accepts children up to age 21 who are enrolled in Medicaid or [place] Health Choice; pregnant women with a Medicaid card; and children who have applied for Medicaid or [place] Health Choice, but were declined, whose parents can pay a reduced fee at time of service.  °[facility] [place]  [address], [place] (336) 641-7733 Accepts children up to age 21 who are  enrolled in Medicaid or [place] Health Choice; pregnant women with a Medicaid card; and children who have applied for Medicaid or [place] Health Choice, but were declined, whose parents can pay a reduced fee at time of service.  °[facility] ° [address], [place] (336) 641-4533 Patients are seen by appointment only. Walk-ins are not accepted. [facility] will see patients 18 years of age and older. °Monday - Tuesday (8am-5pm) °Most Wednesdays (8:30-5pm) °$30 per visit, cash only  °[facility] ° [address], [place] (336) 641-4533 Patients are seen by appointment only. Walk-ins are not accepted. [facility] will see patients 18 years of age and older. °One Wednesday Evening (Monthly: Volunteer Based).  $30 per visit, cash only  °[facility]  (919) 537-3737 for adults; Children under age 4, call Graduate Pediatric Dentistry at (919) [phone]. Children aged 4-14, please call (919) [phone] to request a pediatric application. ° Dental services are provided in all areas of dental care including fillings, crowns and bridges, complete and partial dentures, implants, gum treatment, root canals, and extractions. Preventive care is also provided. Treatment is provided to both adults and children. °Patients are selected via a lottery and there is often a waiting list. °  °[facility] [address], °[place] ° (336) 763-8833 [email or web address] °  °[facility] [address], [place], [place] (336)723-1848, Ext. 123 Second and Fourth Thursday of each month, opens at 6:30 AM; Clinic ends at 9 AM.  Patients are seen on a first-come first-served basis, and a limited number are seen during each clinic.  ° °[facility] ° [address], [place], [place] (336) 723-7904   Eligibility Requirements °You must have lived in [place], [place], or [place] counties for at least the last three months. °  You  cannot be eligible for state or federal sponsored healthcare insurance, including Veterans Administration, Medicaid, or Medicare. °  You generally cannot be eligible for healthcare insurance through your employer.  °  How to apply: °Eligibility screenings are held every Tuesday and Wednesday afternoon from 1:00 pm until 4:00 pm. You do not need an appointment for the interview!  °[facility] [address], [place], [place] 336-631-2330   °[facility]  336-342-8273   °[facility]  336-703-3100   °[facility]  336-570-6415   ° °Behavioral Health Resources in the Community: °Intensive Outpatient Programs °Organization         Address  Phone  Notes  °[facility] [address], [place], [place] 336-878-6098   °[facility] [address], [place], [place] 336-832-9800   °[facility] [address], [place], [place] ° 336-882-2125   °[facility] [address],  °[place], [place] 1-800-853-5163 or 336-641-4981   °Substance Abuse Resources °Organization         Address  Phone  Notes  °[facility]  336-882-2125   °[facility]  336-784-9470   °[facility]  336-285-9073   °[facility]  336-845-3988   °Residential & Outpatient Substance Abuse Program  1-800-659-3381   °Psychological Services °Organization         Address  Phone  Notes  °[facility]  336- 832-9600   °[facility]  336- 378-7881   °[facility] [address], [place] 1-800-853-5163 or 336-641-4981   ° °Mobile Crisis Teams °Organization         Address  Phone  Notes  °[facility], Mobile Crisis Care Unit  1-877-626-1772   °Assertive °[facility] ° [address] [place], [place] 336-834-9664   °Sharon DeEsch [address] °[place] 336-554-5454   ° °Self-Help/Support  Groups °Organization         Address  Phone             Notes  °[facility] - variety of support groups  336- 373-1402 Call for more information  °Narcotics Anonymous (NA), [facility] [address], °[place]  2 meetings at this location  ° °Residential Treatment Programs °Organization         Address  Phone  Notes  °[facility] [address],    °[place]  1-866-801-8205   °[facility] ° [address], [place], [place] 704-293-8524   °[facility] [address], [place] 336-845-3988 Admissions: 8am-3pm M-F  °[facility] [address]    °[place], [place] 336-841-1104   °[facility] [address], [place], [place] 336-379-7146   °[facility] [address]  °[place], [place] 336-285-9073   °[facility] - Intensive Outpatient [address], , [place] 336-852-3033   °[facility] [address]  °[place], [place] 1-877-615-2722 or 336-784-9470   °[facility] [address] [place], [place] 336-227-7417 Accepts Medicaid  °[facility] [address]  ° [place] 1-800-659-3381 Substance Abuse/Addiction Treatment  ° °[place] Behavioral Health Resources °Organization         Address  Phone  Notes  °[facility]  (888) 581-9988   °Julie Brannon, PhD [address] [place], [place]   (336) 349-5553 or (336) 951-0000   °[facility]   [address] °[place],  (336) 349-4454   °[facility] [address], [place],  (336) 342-8316 Insurance/Medicaid/sponsorship through [facility]  °[facility] [address]                                    [place],  (336) 342-8316 Therapy/tele-psych/case  °[facility]   [address]  ° [place], [place] (336) 349-2233    °Dr. Arfeen  (336) 349-4544   °[facility] [place]  [employer] [place] [facility] 1) [address], [place] °2) [address], [place] °3)  [address], [place] (336) 349-3220 °(336) 342-7768 ° °(336) 342-8140   °[place] [facility] (336) 342-1394 or (336) 342-3537 (After Hours)    ° ° ° °

## 2014-07-21 NOTE — ED Notes (Signed)
Pt c/o Right upper toothache from a broken tooth and vomiting onset today.

## 2014-07-21 NOTE — ED Provider Notes (Signed)
CSN: [id number]     Arrival date & time 07/21/14  1310 History   First MD Initiated Contact with Patient 07/21/14 1600     Chief Complaint  Patient presents with  . Dental Pain  . Emesis     (Consider location/radiation/quality/duration/timing/severity/associated sxs/prior Treatment) HPI   48yF with dental pain. R upper tooth. Denies trauma. Worsening over past day. Has tried ibuprofen with minimal relief. No facial swelling. No fever or chills. No difficulty breathing or swallowing.   History reviewed. No pertinent past medical history. History reviewed. No pertinent past surgical history. No family history on file. History  Substance Use Topics  . Smoking status: Never Smoker   . Smokeless tobacco: Not on file  . Alcohol Use: No   OB History    No data available     Review of Systems  All systems reviewed and negative, other than as noted in HPI.   Allergies  Betadine  Home Medications   Prior to Admission medications   Medication Sig Start Date End Date Taking? Authorizing Provider  furosemide (LASIX) 20 MG tablet Take 1 tablet (20 mg total) by mouth daily. 07/13/14  Yes Benjamin Cartner, PA-C  ibuprofen (ADVIL,MOTRIN) 200 MG tablet Take 400 mg by mouth every 6 (six) hours as needed for moderate pain.    Yes Historical Provider, MD   BP 113/71 mmHg  Pulse 52  Temp(Src) 98.2 F (36.8 C) (Oral)  Resp 18  Ht 5\' 7"  (1.702 m)  Wt 132 lb (59.875 kg)  BMI 20.67 kg/m2  SpO2 100%  LMP 07/10/2014 Physical Exam  Constitutional: She appears well-developed and well-nourished. No distress.  HENT:  Head: Normocephalic and atraumatic.  R upper bicuspid fractured versus erosion. Generalized tooth decay. No focal abscess. No swelling. Posterior pharynx clear. Uvula midline. Handling secretions.   Eyes: Conjunctivae are normal. Right eye exhibits no discharge. Left eye exhibits no discharge.  Neck: Neck supple.  Cardiovascular: Normal rate, regular rhythm and normal heart  sounds.  Exam reveals no gallop and no friction rub.   No murmur heard. Pulmonary/Chest: Effort normal and breath sounds normal. No respiratory distress.  Abdominal: Soft. She exhibits no distension. There is no tenderness.  Musculoskeletal: She exhibits no edema or tenderness.  Neurological: She is alert.  Skin: Skin is warm and dry.  Psychiatric: She has a normal mood and affect. Her behavior is normal. Thought content normal.  Nursing note and vitals reviewed.   ED Course  Dental Date/Time: 07/21/2014 4:20 PM Performed by: Raeford RazorKOHUT, Aylanie Cubillos Authorized by: Raeford RazorKOHUT, Lorrin Nawrot Consent: Verbal consent obtained. Consent given by: patient Local anesthesia used: yes Local anesthetic: bupivacaine 0.5% without epinephrine Anesthetic total: 1.5 ml Patient sedated: no Patient tolerance: Patient tolerated the procedure well with no immediate complications Comments: Apical block R upper bicuspid   (including critical care time) Labs Review Labs Reviewed - No data to display  Imaging Review No results found.   EKG Interpretation None      MDM   Final diagnoses:  Pain, dental    48yF with R upper tooth pain. Pt reports onset this afternoon but, of note, pt was seen in ED two months ago with pain in same tooth. Given dose of oxycodone in ED and dental block. Needs definitive dental follow-up. No evidence of deep space head/neck infection. No script for narcs from ED.     Raeford RazorStephen Kaisy Severino, MD 07/24/14 [phone]

## 2014-07-22 MED ORDER — OXYCODONE-ACETAMINOPHEN 5-325 MG PO TABS: 1.0000 | ORAL_TABLET | Freq: Four times a day (QID) | ORAL | Status: DC | PRN

## 2014-07-22 MED ORDER — PENICILLIN V POTASSIUM 500 MG PO TABS: 500.0000 mg | ORAL_TABLET | Freq: Four times a day (QID) | ORAL | Status: DC

## 2014-07-22 MED ORDER — IBUPROFEN 800 MG PO TABS: 800.0000 mg | ORAL_TABLET | Freq: Three times a day (TID) | ORAL | Status: DC | PRN

## 2014-07-22 NOTE — ED Provider Notes (Signed)
CSN: [id number]     Arrival date & time 07/21/14  2301 History   First MD Initiated Contact with Patient 07/21/14 2329     Chief Complaint  Patient presents with  . Dental Pain     (Consider location/radiation/quality/duration/timing/severity/associated sxs/prior Treatment) HPI Patient presents to the emergency department with dental pain that started earlier today.  The patient states that pain is worse.  She states that she was seen here earlier in the dental block has worn off now she is having worsening pain.  Patient states that she does have a dentist.  She denies fever, nausea, vomiting, neck swelling, difficulty swallowing, difficulty breathing, or syncope.  The patient states that nothing since.  He states that nothing seems to make her condition better, but palpation makes the pain worse History reviewed. No pertinent past medical history. History reviewed. No pertinent past surgical history. No family history on file. History  Substance Use Topics  . Smoking status: Never Smoker   . Smokeless tobacco: Not on file  . Alcohol Use: No   OB History    No data available     Review of Systems  All other systems negative except as documented in the HPI. All pertinent positives and negatives as reviewed in the HPI.  Allergies  Betadine  Home Medications   Prior to Admission medications   Medication Sig Start Date End Date Taking? Authorizing Provider  furosemide (LASIX) 20 MG tablet Take 1 tablet (20 mg total) by mouth daily. 07/13/14   Joycie PeekBenjamin Cartner, PA-C  ibuprofen (ADVIL,MOTRIN) 200 MG tablet Take 400 mg by mouth every 6 (six) hours as needed for moderate pain.     Historical Provider, MD   BP 136/78 mmHg  Pulse 60  Temp(Src) 98 F (36.7 C) (Oral)  Resp 14  Ht 5\' 7"  (1.702 m)  Wt 142 lb (64.411 kg)  BMI 22.24 kg/m2  SpO2 10%  LMP 07/10/2014 Physical Exam  Constitutional: She is oriented to person, place, and time. She appears well-developed and well-nourished.  No distress.  HENT:  Head: Normocephalic and atraumatic.  Mouth/Throat: Oropharynx is clear and moist.    Eyes: Pupils are equal, round, and reactive to light.  Neck: Normal range of motion. Neck supple.  Cardiovascular: Normal rate, regular rhythm and normal heart sounds.  Exam reveals no gallop and no friction rub.   No murmur heard. Pulmonary/Chest: Effort normal and breath sounds normal. No respiratory distress.  Neurological: She is alert and oriented to person, place, and time. She exhibits normal muscle tone. Coordination normal.  Skin: Skin is warm and dry. No rash noted. No erythema.  Nursing note and vitals reviewed.   ED Course  Procedures (including critical care time)   The patient will be referred back to her dentist.  Told to return here as needed  Charlestine NightChristopher Gael Londo, PA-C 07/22/14 [phone]  Raeford RazorStephen Kohut, MD 07/23/14 [phone]

## 2014-07-22 NOTE — Discharge Instructions (Signed)
Return here as needed.  Follow-up with the dentist provided or your own dentist

## 2015-01-17 ENCOUNTER — Encounter: Payer: Self-pay | Admitting: *Deleted

## 2015-01-17 ENCOUNTER — Emergency Department
Admission: EM | Admit: 2015-01-17 | Discharge: 2015-01-17 | Disposition: A | Discharge status: Home / Self Care | Payer: BLUE CROSS/BLUE SHIELD | Attending: Emergency Medicine | Admitting: Emergency Medicine

## 2015-01-17 DIAGNOSIS — K029 Dental caries, unspecified: Secondary | ICD-10-CM | POA: Insufficient documentation

## 2015-01-17 DIAGNOSIS — K0889 Other specified disorders of teeth and supporting structures: Secondary | ICD-10-CM | POA: Insufficient documentation

## 2015-01-17 DIAGNOSIS — K0381 Cracked tooth: Secondary | ICD-10-CM | POA: Insufficient documentation

## 2015-01-17 MED ORDER — OXYCODONE-ACETAMINOPHEN 5-325 MG PO TABS: 1.0000 | ORAL_TABLET | Freq: Three times a day (TID) | ORAL | Status: DC | PRN

## 2015-01-17 MED ORDER — IBUPROFEN 600 MG PO TABS: 600.0000 mg | ORAL_TABLET | Freq: Three times a day (TID) | ORAL | Status: DC | PRN

## 2015-01-17 MED ORDER — PENICILLIN V POTASSIUM 500 MG PO TABS: 500.0000 mg | ORAL_TABLET | Freq: Four times a day (QID) | ORAL | Status: DC

## 2015-01-17 NOTE — Discharge Instructions (Signed)
To medication as prescribed. Eat and drink soft food diet.  Follow-up with dentist as soon as possible. Return to emergency room for new or worsening concerns.  OPTIONS FOR DENTAL FOLLOW UP CARE  [place] Department of Health and Human Services - Local Safety Net Dental Clinics TripDoors.comhttp://www.ncdhhs.gov/dph/oralhealth/services/safetynetclinics.htm   [facility] [phone])  [facility] [phone])  [facility] [phone])  [facility] [phone])  [facility] [phone]) This clinic caters to the indigent population and is on a lottery system. Location: [employer], [address], [address], [place] [facility] Hours: Wednesdays from 6pm - 9pm, patients seen by a lottery system. For dates, call or go to [email or web address] Services: Cleanings, fillings and simple extractions. Payment Options: DENTAL WORK IS FREE OF CHARGE. Bring proof of income or support. Best way to get seen: Arrive at 5:15 pm - this is a lottery, NOT first come/first serve, so arriving earlier will not increase your chances of being seen.     [facility] [phone] Select option 1 for emergencies   Location: [facility], [address], [address], [place] [facility] Hours: No walk-ins accepted - call the day before to schedule an appointment. Check in times are 9:30 am and 1:30 pm. Services: Simple extractions, temporary fillings, pulpectomy/pulp debridement, uncomplicated abscess drainage. Payment Options: PAYMENT IS DUE AT THE TIME OF SERVICE.  Fee is usually $100-200, additional surgical procedures (e.g. abscess drainage) may be extra. Cash, checks, Visa/MasterCard accepted.  Can file Medicaid if patient is covered for dental - patient should call case worker to check. No discount for [facility] patients. Best way to get seen: MUST call  the day before and get onto the schedule. Can usually be seen the next 1-2 days. No walk-ins accepted.     [facility] [phone]   Location: [facility], [address], [place] [facility] Hours: M, W, Th, F 8am or 1:30pm, Tues 9a or 1:30 - first come/first served. Services: Simple extractions, temporary fillings, uncomplicated abscess drainage.  You do not need to be [place] resident. Payment Options: PAYMENT IS DUE AT THE TIME OF SERVICE. Dental insurance, otherwise sliding scale - bring proof of income or support. Depending on income and treatment needed, cost is usually $50-200. Best way to get seen: Arrive early as it is first come/first served.     [facility] [phone]   Location: [address] Clinic Hours: Mon-Thu 8a-5p Services: Most basic dental services including extractions and fillings. Payment Options: PAYMENT IS DUE AT THE TIME OF SERVICE. Sliding scale, up to 50% off - bring proof if income or support. Medicaid with dental option accepted. Best way to get seen: Call to schedule an appointment, can usually be seen within 2 weeks OR they will try to see walk-ins - show up at 8a or 2p (you may have to wait).     [facility] [phone] [place] RESIDENTS ONLY   Location: [facility], [address], [place], [place] [id number] Clinic Hours: By appointment only. Monday - Thursday 8am-5pm, Friday 8am-12pm Services: Cleanings, fillings, extractions. Payment Options: PAYMENT IS DUE AT THE TIME OF SERVICE. Cash, Visa or MasterCard. Sliding scale - $30 minimum per service. Best way to get seen: Come in to office, complete packet and make an appointment - need proof of income or support monies for each household member and proof  of [facility] residence. Usually takes about a month to get in.     [facility] [phone]   Location: [address], [place] [facility] Hours: Walk-in Urgent Care Dental Services are offered Monday-Friday mornings only. The numbers of emergencies accepted daily is limited to the number of providers available. Maximum 15 - Mondays, Wednesdays & Thursdays Maximum 10 - Tuesdays & Fridays Services: You do not need to be a [facility] resident to be seen for a dental emergency. Emergencies are defined as pain, swelling, abnormal bleeding, or dental trauma. Walkins will receive x-rays if needed. NOTE: Dental cleaning is not an emergency. Payment Options: PAYMENT IS DUE AT THE TIME OF SERVICE. Minimum co-pay is $40.00 for uninsured patients. Minimum co-pay is $3.00 for Medicaid with dental coverage. Dental Insurance is accepted and must be presented at time of visit. Medicare does not cover dental. Forms of payment: Cash, credit card, checks. Best way to get seen: If not previously registered with the clinic, walk-in dental registration begins at 7:15 am and is on a first come/first serve basis. If previously registered with the clinic, call to make an appointment.     [facility] [place] ONLY   Location: [address] [place], [place] Clinic Hours: Mon-Thu 10a-2p Services: Extractions only! Payment Options: FREE (donations accepted) - bring proof of income or support Best way to get seen: Call and schedule an appointment OR come at 8am on the 1st Monday of every month (except for holidays) when it is first come/first served.     [facility] [phone]   Location: [place] Clinic Hours: Friday mornings Services, Payment Options, Best way to get seen: Call for info

## 2015-01-17 NOTE — ED Notes (Signed)
Pt broke tooth last night, pt complains of dental pain

## 2015-01-17 NOTE — ED Provider Notes (Signed)
[facility] Emergency Department Provider Note  ____________________________________________  Time seen: Approximately 9:06 AM  I have reviewed the triage vital signs and the nursing notes.   HISTORY  Chief Complaint Dental Pain   HPI Deanna Marsh is a 49 y.o. female reason for the complaint of right upper dental pain. Patient reports that she has had several fractured teeth for several years and states that yesterday she was chewing ice which caused her pain to increase. Patient states that she has had continued throbbing pain to right upper tooth since. Patient states she has a history of similar with infection in that tooth area. Patient denies fall, trauma. Denies headache, vision changes, fever, neck pain. Reports continues to eat and drink well. States 10 out of 10 pain to right upper tooth. States worse when touched. Denies other pain or other complaints. Denies ear pain. Denies facial swelling.    History reviewed. No pertinent past medical history.  There are no active problems to display for this patient.   No past surgical history on file.  Current Outpatient Rx  Name  Route  Sig  Dispense  Refill  .           .           .           .             Allergies Betadine  No family history on file.  Social History Social History  Substance Use Topics  . Smoking status: Never Smoker   . Smokeless tobacco: None  . Alcohol Use: No    Review of Systems Constitutional: No fever/chills Eyes: No visual changes. ENT: No sore throat. right upper dental pain.  Cardiovascular: Denies chest pain. Respiratory: Denies shortness of breath. Gastrointestinal: No abdominal pain.  No nausea, no vomiting.  No diarrhea.  No constipation. Genitourinary: Negative for dysuria. Musculoskeletal: Negative for back pain. Skin: Negative for rash. Neurological: Negative for headaches, focal weakness or numbness.  10-point ROS otherwise  negative.  ____________________________________________   PHYSICAL EXAM:  VITAL SIGNS: ED Triage Vitals  Enc Vitals Group     BP 01/17/15 0803 129/80 mmHg     Pulse Rate 01/17/15 0803 75     Resp 01/17/15 0803 18     Temp 01/17/15 0803 98 F (36.7 C)     Temp Source 01/17/15 0803 Oral     SpO2 01/17/15 0803 98 %     Weight 01/17/15 0800 135 lb (61.236 kg)     Height 01/17/15 0800 5\' 7"  (1.702 m)     Head Cir --      Peak Flow --      Pain Score 01/17/15 0801 8     Pain Loc --      Pain Edu? --      Excl. in GC? --     Constitutional: Alert and oriented. Well appearing and in no acute distress. Eyes: Conjunctivae are normal. PERRL. EOMI. Head: Atraumatic.no swelling. No erythema or induration.   Ears: no erythema, normal TMs bilaterally.   Nose: No congestion/rhinnorhea.  Mouth/Throat: Mucous membranes are moist.  Oropharynx non-erythematous. no tonsillar swelling or exudate.  Patient with multiple fractured teeth and widespread dental decay. Patient with right upper fractures existing to tooth numbers 5 and 6 with mild gum erythema surrounding an inflammation. No palpable or visible abscess. No fluctuance.  Neck: No stridor.  No cervical spine tenderness to palpation. Hematological/Lymphatic/Immunilogical: No cervical lymphadenopathy. Cardiovascular: Normal  rate, regular rhythm. Grossly normal heart sounds.  Good peripheral circulation. Respiratory: Normal respiratory effort.  No retractions. Lungs CTAB. Gastrointestinal: Soft and nontender. No distention. Normal Bowel sounds.   Musculoskeletal: No lower or upper extremity tenderness nor edema.    Neurologic:  Normal speech and language. No gait instability. Skin:  Skin is warm, dry and intact. No rash noted. Psychiatric: Mood and affect are normal. Speech and behavior are normal.  ____________________________________________   LABS (all labs ordered are listed, but only abnormal results are displayed)  Labs Reviewed  - No data to display ____________________________________________   INITIAL IMPRESSION / ASSESSMENT AND PLAN / ED COURSE  Pertinent labs & imaging results that were available during my care of the patient were reviewed by me and considered in my medical decision making (see chart for details).  Very well-appearing patient. No acute distress. Moist because membranes. Presents for the complaint of right upper dental pain. Patient with multiple chronic dental fractures and widespread dental decay. No palpable or visualized abscess. Continues to eat and drink well per patient. Will treat with oral Pen-Vee K, when necessary ibuprofen and will give quantity #6 of Percocet. Patient to follow-up with dentist as soon as possible. Patient states that she now has dental insurance and will follow-up with dentist this coming week.Discussed follow up with Primary care physician this week. Discussed follow up and return parameters including no resolution or any worsening concerns. Patient verbalized understanding and agreed to plan.   ____________________________________________   FINAL CLINICAL IMPRESSION(S) / ED DIAGNOSES  Final diagnoses:  Pain, dental       Renford Dills, NP 01/17/15 1610  Jennye Moccasin, MD 01/17/15 [phone]

## 2015-01-20 ENCOUNTER — Emergency Department
Admission: EM | Admit: 2015-01-20 | Discharge: 2015-01-20 | Disposition: A | Discharge status: Home / Self Care | Payer: BLUE CROSS/BLUE SHIELD | Attending: Emergency Medicine | Admitting: Emergency Medicine

## 2015-01-20 ENCOUNTER — Encounter: Payer: Self-pay | Admitting: Emergency Medicine

## 2015-01-20 DIAGNOSIS — R1111 Vomiting without nausea: Secondary | ICD-10-CM | POA: Diagnosis not present

## 2015-01-20 DIAGNOSIS — F419 Anxiety disorder, unspecified: Secondary | ICD-10-CM | POA: Diagnosis not present

## 2015-01-20 DIAGNOSIS — K0889 Other specified disorders of teeth and supporting structures: Secondary | ICD-10-CM | POA: Insufficient documentation

## 2015-01-20 DIAGNOSIS — Z79899 Other long term (current) drug therapy: Secondary | ICD-10-CM | POA: Diagnosis not present

## 2015-01-20 DIAGNOSIS — Z792 Long term (current) use of antibiotics: Secondary | ICD-10-CM | POA: Diagnosis not present

## 2015-01-20 MED ORDER — HYDROMORPHONE HCL 1 MG/ML IJ SOLN
1.0000 mg | Freq: Once | INTRAMUSCULAR | Status: AC
  Administered 2015-01-20: 1 mg via INTRAMUSCULAR
  Filled 2015-01-20: qty 1

## 2015-01-20 MED ORDER — KETOROLAC TROMETHAMINE 60 MG/2ML IM SOLN
60.0000 mg | Freq: Once | INTRAMUSCULAR | Status: AC
  Administered 2015-01-20: 60 mg via INTRAMUSCULAR
  Filled 2015-01-20: qty 2

## 2015-01-20 MED ORDER — ONDANSETRON HCL 4 MG PO TABS: 4.0000 mg | ORAL_TABLET | Freq: Every day | ORAL | Status: DC | PRN

## 2015-01-20 MED ORDER — ONDANSETRON 8 MG PO TBDP: 8.0000 mg | ORAL_TABLET | Freq: Three times a day (TID) | ORAL | Status: DC | PRN

## 2015-01-20 MED ORDER — ONDANSETRON 8 MG PO TBDP
8.0000 mg | ORAL_TABLET | Freq: Once | ORAL | Status: AC
  Administered 2015-01-20: 8 mg via ORAL
  Filled 2015-01-20: qty 1

## 2015-01-20 NOTE — ED Provider Notes (Signed)
[facility] Emergency Department Provider Note  ____________________________________________  Time seen: Approximately 1:14 PM  I have reviewed the triage vital signs and the nursing notes.   HISTORY  Chief Complaint Emesis    HPI Deanna Marsh is a 49 y.o. female patient states status post her visit here on 01/17/2015 she is unable to tolerate the antibiotics and pain medication secondary to vomiting. Patient state she called the dentist today and he told her to come back to emergency room since she was not tolerating the medications. Patient is been no improvement of dental pain and the swelling secondary to her intolerance of the medications because of vomiting. Patient is rating the discomfort as a 8/10.   History reviewed. No pertinent past medical history.  There are no active problems to display for this patient.   History reviewed. No pertinent past surgical history.  Current Outpatient Rx  Name  Route  Sig  Dispense  Refill  . furosemide (LASIX) 20 MG tablet   Oral   Take 1 tablet (20 mg total) by mouth daily.   30 tablet   0   . ibuprofen (ADVIL,MOTRIN) 600 MG tablet   Oral   Take 1 tablet (600 mg total) by mouth every 8 (eight) hours as needed for mild pain or moderate pain.   15 tablet   0   . ondansetron (ZOFRAN) 4 MG tablet   Oral   Take 1 tablet (4 mg total) by mouth daily as needed for nausea or vomiting.   30 tablet   1   . oxyCODONE-acetaminophen (ROXICET) 5-325 MG tablet   Oral   Take 1 tablet by mouth every 8 (eight) hours as needed for moderate pain or severe pain (Do not drive or operate heavy machinery while taking as can cause drowsiness.).   6 tablet   0   . penicillin v potassium (VEETID) 500 MG tablet   Oral   Take 1 tablet (500 mg total) by mouth 4 (four) times daily.   40 tablet   0     Allergies Betadine  No family history on file.  Social History Social History  Substance Use Topics  . Smoking  status: Never Smoker   . Smokeless tobacco: None  . Alcohol Use: No    Review of Systems Constitutional: No fever/chills Eyes: No visual changes. ENT: No sore throat. Dental pain Cardiovascular: Denies chest pain. Respiratory: Denies shortness of breath. Gastrointestinal: No abdominal pain. Nausea and vomiting.  No diarrhea.  No constipation. Genitourinary: Negative for dysuria. Musculoskeletal: Negative for back pain. Skin: Negative for rash. Neurological: Negative for headaches, focal weakness or numbness. 10-point ROS otherwise negative.  ____________________________________________   PHYSICAL EXAM:  VITAL SIGNS: ED Triage Vitals  Enc Vitals Group     BP 01/20/15 1220 131/78 mmHg     Pulse Rate 01/20/15 1220 66     Resp 01/20/15 1220 18     Temp 01/20/15 1220 98.1 F (36.7 C)     Temp Source 01/20/15 1220 Oral     SpO2 01/20/15 1220 100 %     Weight --      Height --      Head Cir --      Peak Flow --      Pain Score 01/20/15 1237 8     Pain Loc --      Pain Edu? --      Excl. in GC? --     Constitutional: Alert and oriented. Well appearing  and in no acute distress. Appears anxious Eyes: Conjunctivae are normal. PERRL. EOMI. Head: Atraumatic. Nose: No congestion/rhinnorhea. Mouth/Throat: Mucous membranes are moist.  Oropharynx non-erythematous. Mild edema to tooth #5 and 6 gingiva is also mildly erythematous and edematous. No abscess observed. Neck: No stridor. No cervical spine tenderness to palpation. Hematological/Lymphatic/Immunilogical: No cervical lymphadenopathy. Cardiovascular: Normal rate, regular rhythm. Grossly normal heart sounds.  Good peripheral circulation. Respiratory: Normal respiratory effort.  No retractions. Lungs CTAB. Gastrointestinal: Soft and nontender. No distention. No abdominal bruits. No CVA tenderness. Musculoskeletal: No lower extremity tenderness nor edema.  No joint effusions. Neurologic:  Normal speech and language. No gross  focal neurologic deficits are appreciated. No gait instability. Skin:  Skin is warm, dry and intact. No rash noted. Psychiatric: Mood and affect are normal. Speech and behavior are normal.  ____________________________________________   LABS (all labs ordered are listed, but only abnormal results are displayed)  Labs Reviewed - No data to display ____________________________________________  EKG   ____________________________________________  RADIOLOGY   ____________________________________________   PROCEDURES  Procedure(s) performed: None  Critical Care performed: No  ____________________________________________   INITIAL IMPRESSION / ASSESSMENT AND PLAN / ED COURSE  Pertinent labs & imaging results that were available during my care of the patient were reviewed by me and considered in my medical decision making (see chart for details) Emesis secondary to medications. Dental pain. Patient given a prescription for Zofran and advised to restart her medications and follow-up with the dental clinic. FINAL CLINICAL IMPRESSION(S) / ED DIAGNOSES  Final diagnoses:  Vomiting without nausea, vomiting of unspecified type      Joni Reining, PA-C 01/20/15 1321  Sharman Cheek, MD 01/21/15 1521

## 2015-01-20 NOTE — Discharge Instructions (Signed)

## 2015-01-20 NOTE — ED Notes (Signed)
Was seen last week for dental abscess  Placed on pcn and percocet for pain/infection. States she developed n/v since Thursday unable to keep anything down.states pain is moving into right side of face.

## 2015-04-06 ENCOUNTER — Encounter: Payer: Self-pay | Admitting: Emergency Medicine

## 2015-04-06 ENCOUNTER — Emergency Department
Admission: EM | Admit: 2015-04-06 | Discharge: 2015-04-06 | Disposition: A | Discharge status: Home / Self Care | Payer: BLUE CROSS/BLUE SHIELD | Attending: Student | Admitting: Student

## 2015-04-06 DIAGNOSIS — Z3202 Encounter for pregnancy test, result negative: Secondary | ICD-10-CM | POA: Insufficient documentation

## 2015-04-06 DIAGNOSIS — Z79899 Other long term (current) drug therapy: Secondary | ICD-10-CM | POA: Diagnosis not present

## 2015-04-06 DIAGNOSIS — N3000 Acute cystitis without hematuria: Secondary | ICD-10-CM | POA: Insufficient documentation

## 2015-04-06 DIAGNOSIS — Z792 Long term (current) use of antibiotics: Secondary | ICD-10-CM | POA: Insufficient documentation

## 2015-04-06 DIAGNOSIS — M545 Low back pain: Secondary | ICD-10-CM | POA: Diagnosis present

## 2015-04-06 LAB — URINALYSIS COMPLETE WITH MICROSCOPIC (ARMC ONLY)
BILIRUBIN URINE: NEGATIVE
Bacteria, UA: NONE SEEN
GLUCOSE, UA: NEGATIVE mg/dL
Hgb urine dipstick: NEGATIVE
KETONES UR: NEGATIVE mg/dL
Leukocytes, UA: NEGATIVE
NITRITE: POSITIVE — AB
Protein, ur: 30 mg/dL — AB
SPECIFIC GRAVITY, URINE: 1.02 (ref 1.005–1.030)
pH: 5 (ref 5.0–8.0)

## 2015-04-06 LAB — POCT PREGNANCY, URINE: PREG TEST UR: NEGATIVE

## 2015-04-06 MED ORDER — CIPROFLOXACIN HCL 500 MG PO TABS: 500.0000 mg | ORAL_TABLET | Freq: Two times a day (BID) | ORAL | Status: DC

## 2015-04-06 NOTE — ED Provider Notes (Signed)
[facility] Emergency Department Provider Note ____________________________________________  Time seen: Approximately 2:02 PM  I have reviewed the triage vital signs and the nursing notes.   HISTORY  Chief Complaint Urinary Tract Infection   HPI Deanna Marsh is a 50 y.o. female who presents to the emergency department for evaluation of possible UTI. She states that she has had some lower back pain and lower abdominal pain for the past few days. Symptoms are similar to previous UTI. She reports having a recent vaginal yeast infection, but she treated it with OTC medication and feels that has resolved. Last UTI was about 8 months ago. She typically has "2 or 3 per year."   History reviewed. No pertinent past medical history.  Patient Active Problem List   Diagnosis Date Noted  . OTALGIA 01/24/2007  . ALLERGIC RHINITIS 01/24/2007    History reviewed. No pertinent past surgical history.  Current Outpatient Rx  Name  Route  Sig  Dispense  Refill  . ciprofloxacin (CIPRO) 500 MG tablet   Oral   Take 1 tablet (500 mg total) by mouth 2 (two) times daily.   6 tablet   0   . furosemide (LASIX) 20 MG tablet   Oral   Take 1 tablet (20 mg total) by mouth daily.   30 tablet   0   . ibuprofen (ADVIL,MOTRIN) 600 MG tablet   Oral   Take 1 tablet (600 mg total) by mouth every 8 (eight) hours as needed for mild pain or moderate pain.   15 tablet   0   . ondansetron (ZOFRAN ODT) 8 MG disintegrating tablet   Oral   Take 1 tablet (8 mg total) by mouth every 8 (eight) hours as needed for nausea or vomiting.   20 tablet   0   . oxyCODONE-acetaminophen (ROXICET) 5-325 MG tablet   Oral   Take 1 tablet by mouth every 8 (eight) hours as needed for moderate pain or severe pain (Do not drive or operate heavy machinery while taking as can cause drowsiness.).   6 tablet   0   . penicillin v potassium (VEETID) 500 MG tablet   Oral   Take 1 tablet (500 mg total) by  mouth 4 (four) times daily.   40 tablet   0     Allergies Povidone-iodine and Betadine  History reviewed. No pertinent family history.  Social History Social History  Substance Use Topics  . Smoking status: Never Smoker   . Smokeless tobacco: None  . Alcohol Use: No    Review of Systems Constitutional: No fever/chills Cardiovascular: Denies chest pain. Respiratory: Denies shortness of breath or cough. Gastrointestinal: Abdominal pain yes, suprapubic., nausea no, vomitingno. Genitourinary: Dysuria yes, vaginal discharge yes, occasional white discharge. Musculoskeletal: Negative for back pain. Skin: Negative for rash. Neurological: Negative for headaches, focal weakness or numbness.  10-point ROS otherwise negative.  ____________________________________________   PHYSICAL EXAM:  VITAL SIGNS: ED Triage Vitals  Enc Vitals Group     BP 04/06/15 1332 111/74 mmHg     Pulse Rate 04/06/15 1332 62     Resp 04/06/15 1332 16     Temp 04/06/15 1332 98.2 F (36.8 C)     Temp Source 04/06/15 1332 Oral     SpO2 04/06/15 1332 97 %     Weight 04/06/15 1332 132 lb (59.875 kg)     Height 04/06/15 1332  (1.702 m)     Head Cir --      Peak  Flow --      Pain Score 04/06/15 1333 7     Pain Loc --      Pain Edu? --      Excl. in GC? --     Constitutional: Alert and oriented. Well appearing and in no acute distress. Eyes: Conjunctivae are normal. PERRL. EOMI. Head: Atraumatic. Nose: No congestion/rhinnorhea. Mouth/Throat: Mucous membranes are moist.  Oropharynx non-erythematous. Neck: No stridor. Cardiovascular: Good peripheral circulation. Respiratory: Normal respiratory effort.  No retractions. Gastrointestinal: Soft with mild suprapubic tenderness. No distention. No abdominal bruits. Genitourinary: Pelvic exam: Deferred. Musculoskeletal: No extremity tenderness nor edema. No CVA tenderness. Neurologic:  Normal speech and language. No gross focal neurologic deficits  are appreciated. Speech is normal. No gait instability. Skin:  Skin is warm, dry and intact. No rash noted. Psychiatric: Mood and affect are normal. Speech and behavior are normal.  ____________________________________________   LABS (all labs ordered are listed, but only abnormal results are displayed)  Labs Reviewed  URINALYSIS COMPLETEWITH MICROSCOPIC ([facility] ONLY) - Abnormal; Notable for the following:    Color, Urine AMBER (*)    APPearance CLEAR (*)    Protein, ur 30 (*)    Nitrite POSITIVE (*)    Squamous Epithelial / LPF 6-30 (*)    All other components within normal limits  POC URINE PREG, ED  POCT PREGNANCY, URINE   ____________________________________________  RADIOLOGY  Not indicated. ____________________________________________   PROCEDURES  Procedure(s) performed: None  ____________________________________________   INITIAL IMPRESSION / ASSESSMENT AND PLAN / ED COURSE  Pertinent labs & imaging results that were available during my care of the patient were reviewed by me and considered in my medical decision making (see chart for details).  Patient was advised to follow up with gynecology for symptoms that are not improving over the next 7 days. She was  also advised to return to the ER for symptoms that change or worsen if unable to schedule an appointment.  ____________________________________________   FINAL CLINICAL IMPRESSION(S) / ED DIAGNOSES  Final diagnoses:  Acute cystitis without hematuria       Chinita Pester, FNP 04/06/15 1553  Gayla Doss, MD 04/06/15 1640

## 2015-04-06 NOTE — ED Notes (Signed)
Frequent uti's and having symptoms again. Burning, frequency

## 2015-04-06 NOTE — Discharge Instructions (Signed)

## 2015-04-13 ENCOUNTER — Emergency Department (HOSPITAL_COMMUNITY)
Admission: EM | Admit: 2015-04-13 | Discharge: 2015-04-13 | Disposition: A | Discharge status: Home / Self Care | Payer: BLUE CROSS/BLUE SHIELD | Attending: Emergency Medicine | Admitting: Emergency Medicine

## 2015-04-13 ENCOUNTER — Encounter (HOSPITAL_COMMUNITY): Payer: Self-pay | Admitting: *Deleted

## 2015-04-13 DIAGNOSIS — Z792 Long term (current) use of antibiotics: Secondary | ICD-10-CM | POA: Insufficient documentation

## 2015-04-13 DIAGNOSIS — Z79899 Other long term (current) drug therapy: Secondary | ICD-10-CM | POA: Insufficient documentation

## 2015-04-13 DIAGNOSIS — N390 Urinary tract infection, site not specified: Secondary | ICD-10-CM | POA: Diagnosis not present

## 2015-04-13 DIAGNOSIS — R350 Frequency of micturition: Secondary | ICD-10-CM | POA: Diagnosis present

## 2015-04-13 LAB — URINALYSIS, ROUTINE W REFLEX MICROSCOPIC
GLUCOSE, UA: NEGATIVE mg/dL
HGB URINE DIPSTICK: NEGATIVE
Ketones, ur: 15 mg/dL — AB
Nitrite: POSITIVE — AB
PH: 6 (ref 5.0–8.0)
Protein, ur: 30 mg/dL — AB
SPECIFIC GRAVITY, URINE: 1.02 (ref 1.005–1.030)

## 2015-04-13 LAB — WET PREP, GENITAL
Sperm: NONE SEEN
TRICH WET PREP: NONE SEEN
YEAST WET PREP: NONE SEEN

## 2015-04-13 LAB — URINE MICROSCOPIC-ADD ON

## 2015-04-13 MED ORDER — STERILE WATER FOR INJECTION IJ SOLN
INTRAMUSCULAR | Status: AC
  Administered 2015-04-13: 1 mL
  Filled 2015-04-13: qty 10

## 2015-04-13 MED ORDER — CEPHALEXIN 500 MG PO CAPS: 500.0000 mg | ORAL_CAPSULE | Freq: Four times a day (QID) | ORAL | Status: DC

## 2015-04-13 MED ORDER — AZITHROMYCIN 250 MG PO TABS
1000.0000 mg | ORAL_TABLET | Freq: Once | ORAL | Status: AC
Start: 2015-04-13 — End: 2015-04-13
  Administered 2015-04-13: 1000 mg via ORAL
  Filled 2015-04-13: qty 4

## 2015-04-13 MED ORDER — CEFTRIAXONE SODIUM 250 MG IJ SOLR
250.0000 mg | Freq: Once | INTRAMUSCULAR | Status: AC
  Administered 2015-04-13: 250 mg via INTRAMUSCULAR
  Filled 2015-04-13: qty 250

## 2015-04-13 NOTE — ED Provider Notes (Signed)
CSN: [id number]     Arrival date & time 04/13/15  1557 History   First MD Initiated Contact with Patient 04/13/15 1653     Chief Complaint  Patient presents with  . Urinary Frequency     (Consider location/radiation/quality/duration/timing/severity/associated sxs/prior Treatment) HPI Deanna Marsh is a 50 y.o. female with no medical problems, presents to emergency department complaining of urinary frequency, dysuria, lower abdominal pain. Patient states symptoms started about a week ago. She was seen in emergency department at [facility] that time was diagnosed with a UTI. She was placed on Cipro for 3 days. She states while taking the Cipro her symptoms improved, however the day after she stopped Cipro, her symptoms returned. She denies any fever or chills. No nausea or vomiting. She does report unprotected intercourse about a month ago, states no longer without person. She does report some increased vaginal discharge since then. She is currently taking. EM for her symptoms with some improvement.  History reviewed. No pertinent past medical history. History reviewed. No pertinent past surgical history. History reviewed. No pertinent family history. Social History  Substance Use Topics  . Smoking status: Never Smoker   . Smokeless tobacco: None  . Alcohol Use: No   OB History    No data available     Review of Systems  Constitutional: Negative for fever and chills.  Respiratory: Negative for cough, chest tightness and shortness of breath.   Cardiovascular: Negative for chest pain, palpitations and leg swelling.  Gastrointestinal: Positive for abdominal pain. Negative for nausea, vomiting and diarrhea.  Genitourinary: Positive for dysuria, urgency, frequency, vaginal discharge and pelvic pain. Negative for flank pain, vaginal bleeding and vaginal pain.  Musculoskeletal: Negative for myalgias, arthralgias, neck pain and neck stiffness.  Skin: Negative for rash.  Neurological:  Negative for dizziness, weakness and headaches.  All other systems reviewed and are negative.     Allergies  Povidone-iodine and Betadine  Home Medications   Prior to Admission medications   Medication Sig Start Date End Date Taking? Authorizing Provider  ciprofloxacin (CIPRO) 500 MG tablet Take 1 tablet (500 mg total) by mouth 2 (two) times daily. 04/06/15   Chinita Pester, FNP  furosemide (LASIX) 20 MG tablet Take 1 tablet (20 mg total) by mouth daily. 07/13/14   Joycie Peek, PA-C  ibuprofen (ADVIL,MOTRIN) 600 MG tablet Take 1 tablet (600 mg total) by mouth every 8 (eight) hours as needed for mild pain or moderate pain. 01/17/15   Renford Dills, NP  ondansetron (ZOFRAN ODT) 8 MG disintegrating tablet Take 1 tablet (8 mg total) by mouth every 8 (eight) hours as needed for nausea or vomiting. 01/20/15   Joni Reining, PA-C  oxyCODONE-acetaminophen (ROXICET) 5-325 MG tablet Take 1 tablet by mouth every 8 (eight) hours as needed for moderate pain or severe pain (Do not drive or operate heavy machinery while taking as can cause drowsiness.). 01/17/15   Renford Dills, NP  penicillin v potassium (VEETID) 500 MG tablet Take 1 tablet (500 mg total) by mouth 4 (four) times daily. 01/17/15   Renford Dills, NP   BP 123/72 mmHg  Pulse 70  Temp(Src) 97.8 F (36.6 C) (Oral)  Resp 18  SpO2 100% Physical Exam  Constitutional: She is oriented to person, place, and time. She appears well-developed and well-nourished. No distress.  HENT:  Head: Normocephalic.  Eyes: Conjunctivae are normal.  Neck: Neck supple.  Cardiovascular: Normal rate, regular rhythm and normal heart sounds.   Pulmonary/Chest: Effort normal and breath  sounds normal. No respiratory distress. She has no wheezes. She has no rales.  Abdominal: Soft. Bowel sounds are normal. She exhibits no distension. There is tenderness. There is no rebound.  Suprapubic tenderness. No CVA tenderness bilaterally  Genitourinary:  Normal  external genitalia. Normal vaginal canal. Small thin white discharge. Cervix is normal, closed. No CMT. No uterine or adnexal tenderness. No masses palpated.    Musculoskeletal: She exhibits no edema.  Neurological: She is alert and oriented to person, place, and time.  Skin: Skin is warm and dry.  Psychiatric: She has a normal mood and affect. Her behavior is normal.  Nursing note and vitals reviewed.   ED Course  Procedures (including critical care time) Labs Review Labs Reviewed  WET PREP, GENITAL - Abnormal; Notable for the following:    Clue Cells Wet Prep HPF POC PRESENT (*)    WBC, Wet Prep HPF POC MANY (*)    All other components within normal limits  URINALYSIS, ROUTINE W REFLEX MICROSCOPIC (NOT AT [facility]) - Abnormal; Notable for the following:    Color, Urine ORANGE (*)    APPearance CLOUDY (*)    Bilirubin Urine SMALL (*)    Ketones, ur 15 (*)    Protein, ur 30 (*)    Nitrite POSITIVE (*)    Leukocytes, UA LARGE (*)    All other components within normal limits  URINE MICROSCOPIC-ADD ON - Abnormal; Notable for the following:    Squamous Epithelial / LPF 6-30 (*)    Bacteria, UA FEW (*)    All other components within normal limits  URINE CULTURE  GC/CHLAMYDIA PROBE AMP ([place]) NOT AT [facility]    Imaging Review No results found. I have personally reviewed and evaluated these images and lab results as part of my medical decision-making.   EKG Interpretation None      MDM   Final diagnoses:  UTI (lower urinary tract infection)    patients with pelvic pain, urinary symptoms, reports also some vaginal discharge. Was treated for UTI 1 week ago with a 3 course of Cipro, states improved initially but then once Cipro ran out symptoms return. Question resistance to Cipro versus need for longer treatment. Urinalysis repeated today and still shows infection with nitrite positive and leukocytes moderate. Will send culture this time. Will start on Keflex. Patient is  nontoxic appearing. No evidence of pyelonephritis, no CVA tenderness, and denies any fever. She is afebrile here.  Wet prep does show many white blood cells and some clue cells present. Will send for gonorrhea and chlamydia cultures. The patient chose to be treated today for possible STI. Treated with 200 mg of Rocephin IM. Zithromax 1 g by mouth. Will have patient follow-up for recheck. Return precautions discussed.  Filed Vitals:   04/13/15 1603 04/13/15 1915  BP: 123/72 122/67  Pulse: 70 64  Temp: 97.8 F (36.6 C) 98 F (36.7 C)  TempSrc: Oral Oral  Resp: 18 18  SpO2: 100% 100%     Jaynie Crumble, PA-C 04/13/15 1924  Gerhard Munch, MD 04/13/15 2328

## 2015-04-13 NOTE — Discharge Instructions (Signed)
Take Keflex as prescribed until all gone. Please follow-up with a primary care doctor for recheck of symptoms not improving. Return if worsening. You were also treated today for possible gonorrhea or chlamydia, however the cultures are pending and you will be called in approximately 2-3 days if they return abnormal.  Urinary Tract Infection Urinary tract infections (UTIs) can develop anywhere along your urinary tract. Your urinary tract is your body's drainage system for removing wastes and extra water. Your urinary tract includes two kidneys, two ureters, a bladder, and a urethra. Your kidneys are a pair of bean-shaped organs. Each kidney is about the size of your fist. They are located below your ribs, one on each side of your spine. CAUSES Infections are caused by microbes, which are microscopic organisms, including fungi, viruses, and bacteria. These organisms are so small that they can only be seen through a microscope. Bacteria are the microbes that most commonly cause UTIs. SYMPTOMS  Symptoms of UTIs may vary by age and gender of the patient and by the location of the infection. Symptoms in young women typically include a frequent and intense urge to urinate and a painful, burning feeling in the bladder or urethra during urination. Older women and men are more likely to be tired, shaky, and weak and have muscle aches and abdominal pain. A fever may mean the infection is in your kidneys. Other symptoms of a kidney infection include pain in your back or sides below the ribs, nausea, and vomiting. DIAGNOSIS To diagnose a UTI, your caregiver will ask you about your symptoms. Your caregiver will also ask you to provide a urine sample. The urine sample will be tested for bacteria and white blood cells. White blood cells are made by your body to help fight infection. TREATMENT  Typically, UTIs can be treated with medication. Because most UTIs are caused by a bacterial infection, they usually can be  treated with the use of antibiotics. The choice of antibiotic and length of treatment depend on your symptoms and the type of bacteria causing your infection. HOME CARE INSTRUCTIONS  If you were prescribed antibiotics, take them exactly as your caregiver instructs you. Finish the medication even if you feel better after you have only taken some of the medication.  Drink enough water and fluids to keep your urine clear or pale yellow.  Avoid caffeine, tea, and carbonated beverages. They tend to irritate your bladder.  Empty your bladder often. Avoid holding urine for long periods of time.  Empty your bladder before and after sexual intercourse.  After a bowel movement, women should cleanse from front to back. Use each tissue only once. SEEK MEDICAL CARE IF:   You have back pain.  You develop a fever.  Your symptoms do not begin to resolve within 3 days. SEEK IMMEDIATE MEDICAL CARE IF:   You have severe back pain or lower abdominal pain.  You develop chills.  You have nausea or vomiting.  You have continued burning or discomfort with urination. MAKE SURE YOU:   Understand these instructions.  Will watch your condition.  Will get help right away if you are not doing well or get worse.   This information is not intended to replace advice given to you by your health care provider. Make sure you discuss any questions you have with your health care provider.   Document Released: 12/09/2004 Document Revised: 11/20/2014 Document Reviewed: 04/09/2011 Elsevier Interactive Patient Education [employer]

## 2015-04-13 NOTE — ED Notes (Signed)
Pt seen at [facility] on 1/22 and diagnosed with UTI and given antibiotic but no relief, still has pelvic pressure and pain.

## 2015-04-14 LAB — GC/CHLAMYDIA PROBE AMP (~~LOC~~) NOT AT ARMC
CHLAMYDIA, DNA PROBE: NEGATIVE
Neisseria Gonorrhea: NEGATIVE

## 2015-04-15 LAB — URINE CULTURE

## 2015-04-16 ENCOUNTER — Telehealth (HOSPITAL_BASED_OUTPATIENT_CLINIC_OR_DEPARTMENT_OTHER): Payer: Self-pay | Admitting: Emergency Medicine

## 2015-04-16 NOTE — Telephone Encounter (Signed)
Post ED Visit - Positive Culture Follow-up: Successful Patient Follow-Up  Culture assessed and recommendations reviewed by:  Enzo Bi, Pharm.D.  Celedonio Miyamoto, Pharm.D., BCPS  Garvin Fila, Pharm.D.  Georgina Pillion, Pharm.D., BCPS  [place], [place].D., BCPS, AAHIVP  Estella Husk, Pharm.D., BCPS, AAHIVP  Tennis Must, Pharm.D.  Rob Oswaldo Done, [address].D.  Positive urine culture Lactobacillus   Patient discharged without antimicrobial prescription and treatment is now indicated  Organism is resistant to prescribed ED discharge antimicrobial  Patient with positive blood cultures  Changes discussed with ED provider: Fayrene Helper PA New antibiotic prescription stop cephalexin,s tart Flagyl  po bid x 7 days for bacterial vaginosis, still with vaginal discharge Called to [facility] / [address] [phone]  Contacted patient, 04/16/15 1128   Berle Mull 04/16/2015, 11:26 AM

## 2015-04-16 NOTE — Progress Notes (Signed)
ED Antimicrobial Stewardship Positive Culture Follow Up   Deanna Marsh is an 50 y.o. female who presented to [facility] on 04/13/2015 with a chief complaint of  Chief Complaint  Patient presents with  . Urinary Frequency    Recent Results (from the past 720 hour(s))  Urine culture     Status: None   Collection Time: 04/13/15  4:07 PM  Result Value Ref Range Status   Specimen Description URINE, CLEAN CATCH  Final   Special Requests NONE  Final   Culture   Final    >=100,000 COLONIES/mL LACTOBACILLUS SPECIES Standardized susceptibility testing for this organism is not available.    Report Status 04/15/2015 FINAL  Final  Wet prep, genital     Status: Abnormal   Collection Time: 04/13/15  5:44 PM  Result Value Ref Range Status   Yeast Wet Prep HPF POC NONE SEEN NONE SEEN Final   Trich, Wet Prep NONE SEEN NONE SEEN Final   Clue Cells Wet Prep HPF POC PRESENT (A) NONE SEEN Final   WBC, Wet Prep HPF POC MANY (A) NONE SEEN Final   Sperm NONE SEEN  Final    32 YOF who was recently seen at [facility] on 1/22 and given Cipro for 3 days for UTI with no relief. The patient represented on 1/29 with pelvic pain/pressure and increased vaginal discharge. The patient was treated with Rocephin + Azithro in the [facility] for STD coverage and discharged on Keflex for a UTI.  The urine cultures have resulted as lactobacillus which is likely a colonizer and not a true pathogen. The patient was noted to have positive clue cells on the wet prep with increased vaginal discharge concerning for BV.  New antibiotic prescription: D/c Keflex and call the patient for a symptom check - if still symptomatic and positive for vaginal discharge - can be treated for BV with Flagyl 500 mg po twice daily for 7 days.   ED Provider: Fayrene Helper, PA-C  Rolley Sims 04/16/2015, 10:09 AM Infectious Diseases Pharmacist Phone# [phone]

## 2015-06-03 ENCOUNTER — Emergency Department
Admission: EM | Admit: 2015-06-03 | Discharge: 2015-06-03 | Disposition: A | Discharge status: Home / Self Care | Payer: BLUE CROSS/BLUE SHIELD | Attending: Emergency Medicine | Admitting: Emergency Medicine

## 2015-06-03 DIAGNOSIS — J069 Acute upper respiratory infection, unspecified: Secondary | ICD-10-CM | POA: Insufficient documentation

## 2015-06-03 DIAGNOSIS — Z888 Allergy status to other drugs, medicaments and biological substances status: Secondary | ICD-10-CM | POA: Diagnosis not present

## 2015-06-03 DIAGNOSIS — R509 Fever, unspecified: Secondary | ICD-10-CM | POA: Diagnosis present

## 2015-06-03 LAB — CBC WITH DIFFERENTIAL/PLATELET
Basophils Absolute: 0 K/uL (ref 0–0.1)
Basophils Relative: 1 %
Eosinophils Absolute: 0 K/uL (ref 0–0.7)
Eosinophils Relative: 0 %
HCT: 34 % — ABNORMAL LOW (ref 35.0–47.0)
Hemoglobin: 11.2 g/dL — ABNORMAL LOW (ref 12.0–16.0)
Lymphocytes Relative: 13 %
Lymphs Abs: 0.5 K/uL — ABNORMAL LOW (ref 1.0–3.6)
MCH: 28.4 pg (ref 26.0–34.0)
MCHC: 32.8 g/dL (ref 32.0–36.0)
MCV: 86.6 fL (ref 80.0–100.0)
Monocytes Absolute: 0.9 K/uL (ref 0.2–0.9)
Monocytes Relative: 22 %
Neutro Abs: 2.5 K/uL (ref 1.4–6.5)
Neutrophils Relative %: 64 %
Platelets: 156 K/uL (ref 150–440)
RBC: 3.93 MIL/uL (ref 3.80–5.20)
RDW: 15.6 % — ABNORMAL HIGH (ref 11.5–14.5)
WBC: 3.9 K/uL (ref 3.6–11.0)

## 2015-06-03 LAB — RAPID INFLUENZA A&B ANTIGENS (ARMC ONLY)
INFLUENZA A (ARMC): NEGATIVE
INFLUENZA B (ARMC): NEGATIVE

## 2015-06-03 MED ORDER — AZITHROMYCIN 250 MG PO TABS: ORAL_TABLET | ORAL | Status: AC

## 2015-06-03 MED ORDER — CYCLOBENZAPRINE HCL 10 MG PO TABS: 10.0000 mg | ORAL_TABLET | Freq: Three times a day (TID) | ORAL | Status: AC | PRN

## 2015-06-03 MED ORDER — IBUPROFEN 800 MG PO TABS: 800.0000 mg | ORAL_TABLET | Freq: Three times a day (TID) | ORAL | Status: AC | PRN

## 2015-06-03 MED ORDER — GUAIFENESIN-CODEINE 100-10 MG/5ML PO SOLN: 10.0000 mL | ORAL | Status: AC | PRN

## 2015-06-03 NOTE — ED Notes (Signed)
Pt alert and oriented X4, active, cooperative, pt in NAD. RR even and unlabored, color WNL.  Pt informed to return if any life threatening symptoms occur.   

## 2015-06-03 NOTE — ED Provider Notes (Signed)
[facility] Emergency Department Provider Note  ____________________________________________  Time seen: Approximately 1:20 PM  I have reviewed the triage vital signs and the nursing notes.   HISTORY  Chief Complaint Influenza    HPI Deanna Marsh is a 50 y.o. female who presents for evaluation of body aches sore throat and fever of sudden onset yesterday. Fever was 102 last night complains of just hurting and aching all over. History addition patient complains of having a headache with a pain is 10 over 10 at this point.   History reviewed. No pertinent past medical history.  Patient Active Problem List   Diagnosis Date Noted  . OTALGIA 01/24/2007  . ALLERGIC RHINITIS 01/24/2007    Past Surgical History  Procedure Laterality Date  . Tubal ligation    . Foot arthroplasty    . Tubes in ears      Current Outpatient Rx  Name  Route  Sig  Dispense  Refill  . azithromycin (ZITHROMAX Z-PAK) 250 MG tablet      Take 2 tablets (500 mg) on  Day 1,  followed by 1 tablet (250 mg) once daily on Days 2 through 5.   6 each   0   . cyclobenzaprine (FLEXERIL) 10 MG tablet   Oral   Take 1 tablet (10 mg total) by mouth every 8 (eight) hours as needed for muscle spasms.   30 tablet   1   . guaiFENesin-codeine 100-10 MG/5ML syrup   Oral   Take 10 mLs by mouth every 4 (four) hours as needed for cough.   180 mL   0   . ibuprofen (ADVIL,MOTRIN) 800 MG tablet   Oral   Take 1 tablet (800 mg total) by mouth every 8 (eight) hours as needed.   30 tablet   0     Allergies Povidone-iodine and Betadine  No family history on file.  Social History Social History  Substance Use Topics  . Smoking status: Never Smoker   . Smokeless tobacco: None  . Alcohol Use: No    Review of Systems Constitutional: Positive fever/chills Eyes: No visual changes. ENT: No sore throat. Cardiovascular: Denies chest pain. Respiratory: Denies shortness of breath. Positive  cough with mild green sputum Musculoskeletal: Positive for generalized muscle pain. Skin: Negative for rash. Neurological: Negative for headaches, focal weakness or numbness.  10-point ROS otherwise negative.  ____________________________________________   PHYSICAL EXAM:  VITAL SIGNS: ED Triage Vitals  Enc Vitals Group     BP 06/03/15 1227 111/82 mmHg     Pulse Rate 06/03/15 1227 75     Resp 06/03/15 1227 18     Temp 06/03/15 1227 99 F (37.2 C)     Temp src --      SpO2 06/03/15 1227 99 %     Weight 06/03/15 1227 136 lb (61.689 kg)     Height 06/03/15 1227  (1.702 m)     Head Cir --      Peak Flow --      Pain Score 06/03/15 1227 9     Pain Loc --      Pain Edu? --      Excl. in GC? --     Constitutional: Alert and oriented. Well appearing and in no acute distress. Nose: Positive congestion/rhinnorhea. Bilateral turbinates swollen noted Mouth/Throat: Mucous membranes are moist.  Oropharynx non-erythematous. Neck: No stridor.  Full range of motion nontender no cervical adenopathy noted. Cardiovascular: Normal rate, regular rhythm. Grossly normal heart sounds.  Good peripheral circulation. Respiratory: Normal respiratory effort.  No retractions. Lungs CTAB. Musculoskeletal: No lower extremity tenderness nor edema.  No joint effusions. Neurologic:  Normal speech and language. No gross focal neurologic deficits are appreciated. No gait instability. Skin:  Skin is warm, dry and intact. No rash noted. Psychiatric: Mood and affect are normal. Speech and behavior are normal.  ____________________________________________   LABS (all labs ordered are listed, but only abnormal results are displayed)  Labs Reviewed  CBC WITH DIFFERENTIAL/PLATELET - Abnormal; Notable for the following:    Hemoglobin 11.2 (*)    HCT 34.0 (*)    RDW 15.6 (*)    Lymphs Abs 0.5 (*)    All other components within normal limits  RAPID INFLUENZA A&B ANTIGENS ([facility] ONLY)    RADIOLOGY  No  acute cardiopulmonary findings. ____________________________________________   PROCEDURES  Procedure(s) performed: None  Critical Care performed: No  ____________________________________________   INITIAL IMPRESSION / ASSESSMENT AND PLAN / ED COURSE  Pertinent labs & imaging results that were available during my care of the patient were reviewed by me and considered in my medical decision making (see chart for details).  Acute upper respiratory infection. Reassurance provided Rx given for Zithromax, Motrin 800, Robitussin-AC and Sudafed. Patient to follow up PCP or return to the ER with any worsening symptomology. Patient voices no other emergency medical complaints at this time. ____________________________________________   FINAL CLINICAL IMPRESSION(S) / ED DIAGNOSES  Final diagnoses:  URI, acute     This chart was dictated using voice recognition software/Dragon. Despite best efforts to proofread, errors can occur which can change the meaning. Any change was purely unintentional.   Evangeline Dakinharles M Sharisa Toves, PA-C 06/03/15 1456  Myrna Blazeravid Matthew Schaevitz, MD 06/03/15 [phone]

## 2015-06-03 NOTE — ED Notes (Signed)
States she developed body aches sore throat and fever yesterday  States her fever was 102 last pm  Low grade temp on arrival

## 2015-06-03 NOTE — Discharge Instructions (Signed)
Upper Respiratory Infection, Adult Most upper respiratory infections (URIs) are a viral infection of the air passages leading to the lungs. A URI affects the nose, throat, and upper air passages. The most common type of URI is nasopharyngitis and is typically referred to as "the common cold." URIs run their course and usually go away on their own. Most of the time, a URI does not require medical attention, but sometimes a bacterial infection in the upper airways can follow a viral infection. This is called a secondary infection. Sinus and middle ear infections are common types of secondary upper respiratory infections. Bacterial pneumonia can also complicate a URI. A URI can worsen asthma and chronic obstructive pulmonary disease (COPD). Sometimes, these complications can require emergency medical care and may be life threatening.  CAUSES Almost all URIs are caused by viruses. A virus is a type of germ and can spread from one person to another.  RISKS FACTORS You may be at risk for a URI if:   You smoke.   You have chronic heart or lung disease.  You have a weakened defense (immune) system.   You are very young or very old.   You have nasal allergies or asthma.  You work in crowded or poorly ventilated areas.  You work in health care facilities or schools. SIGNS AND SYMPTOMS  Symptoms typically develop 2-3 days after you come in contact with a cold virus. Most viral URIs last 7-10 days. However, viral URIs from the influenza virus (flu virus) can last 14-18 days and are typically more severe. Symptoms may include:   Runny or stuffy (congested) nose.   Sneezing.   Cough.   Sore throat.   Headache.   Fatigue.   Fever.   Loss of appetite.   Pain in your forehead, behind your eyes, and over your cheekbones (sinus pain).  Muscle aches.  DIAGNOSIS  Your health care provider may diagnose a URI by:  Physical exam.  Tests to check that your symptoms are not due to  another condition such as:  Strep throat.  Sinusitis.  Pneumonia.  Asthma. TREATMENT  A URI goes away on its own with time. It cannot be cured with medicines, but medicines may be prescribed or recommended to relieve symptoms. Medicines may help:  Reduce your fever.  Reduce your cough.  Relieve nasal congestion. HOME CARE INSTRUCTIONS   Take medicines only as directed by your health care provider.   Gargle warm saltwater or take cough drops to comfort your throat as directed by your health care provider.  Use a warm mist humidifier or inhale steam from a shower to increase air moisture. This may make it easier to breathe.  Drink enough fluid to keep your urine clear or pale yellow.   Eat soups and other clear broths and maintain good nutrition.   Rest as needed.   Return to work when your temperature has returned to normal or as your health care provider advises. You may need to stay home longer to avoid infecting others. You can also use a face mask and careful hand washing to prevent spread of the virus.  Increase the usage of your inhaler if you have asthma.   Do not use any tobacco products, including cigarettes, chewing tobacco, or electronic cigarettes. If you need help quitting, ask your health care provider. PREVENTION  The best way to protect yourself from getting a cold is to practice good hygiene.   Avoid oral or hand contact with people with cold   symptoms.   Wash your hands often if contact occurs.  There is no clear evidence that vitamin C, vitamin E, echinacea, or exercise reduces the chance of developing a cold. However, it is always recommended to get plenty of rest, exercise, and practice good nutrition.  SEEK MEDICAL CARE IF:   You are getting worse rather than better.   Your symptoms are not controlled by medicine.   You have chills.  You have worsening shortness of breath.  You have brown or red mucus.  You have yellow or brown nasal  discharge.  You have pain in your face, especially when you bend forward.  You have a fever.  You have swollen neck glands.  You have pain while swallowing.  You have white areas in the back of your throat. SEEK IMMEDIATE MEDICAL CARE IF:   You have severe or persistent:  Headache.  Ear pain.  Sinus pain.  Chest pain.  You have chronic lung disease and any of the following:  Wheezing.  Prolonged cough.  Coughing up blood.  A change in your usual mucus.  You have a stiff neck.  You have changes in your:  Vision.  Hearing.  Thinking.  Mood. MAKE SURE YOU:   Understand these instructions.  Will watch your condition.  Will get help right away if you are not doing well or get worse.   This information is not intended to replace advice given to you by your health care provider. Make sure you discuss any questions you have with your health care provider.   Document Released: 08/25/2000 Document Revised: 07/16/2014 Document Reviewed: 06/06/2013 Elsevier Interactive Patient Education 2016 [employer]  

## 2015-06-03 NOTE — ED Notes (Signed)
Pt c/o bodyaches, fever, cough congestion since yesterday with a HA

## 2015-11-02 ENCOUNTER — Emergency Department: Payer: Self-pay

## 2015-11-02 ENCOUNTER — Encounter: Payer: Self-pay | Admitting: Emergency Medicine

## 2015-11-02 ENCOUNTER — Emergency Department
Admission: EM | Admit: 2015-11-02 | Discharge: 2015-11-02 | Disposition: A | Discharge status: Home / Self Care | Payer: Self-pay | Attending: Emergency Medicine | Admitting: Emergency Medicine

## 2015-11-02 DIAGNOSIS — Z79899 Other long term (current) drug therapy: Secondary | ICD-10-CM | POA: Insufficient documentation

## 2015-11-02 DIAGNOSIS — Z791 Long term (current) use of non-steroidal anti-inflammatories (NSAID): Secondary | ICD-10-CM | POA: Insufficient documentation

## 2015-11-02 DIAGNOSIS — Z792 Long term (current) use of antibiotics: Secondary | ICD-10-CM | POA: Insufficient documentation

## 2015-11-02 DIAGNOSIS — S92001A Unspecified fracture of right calcaneus, initial encounter for closed fracture: Secondary | ICD-10-CM | POA: Insufficient documentation

## 2015-11-02 DIAGNOSIS — Y999 Unspecified external cause status: Secondary | ICD-10-CM | POA: Insufficient documentation

## 2015-11-02 DIAGNOSIS — Y929 Unspecified place or not applicable: Secondary | ICD-10-CM | POA: Insufficient documentation

## 2015-11-02 DIAGNOSIS — Y939 Activity, unspecified: Secondary | ICD-10-CM | POA: Insufficient documentation

## 2015-11-02 DIAGNOSIS — W11XXXA Fall on and from ladder, initial encounter: Secondary | ICD-10-CM | POA: Insufficient documentation

## 2015-11-02 MED ORDER — OXYCODONE-ACETAMINOPHEN 5-325 MG PO TABS
1.0000 | ORAL_TABLET | Freq: Once | ORAL | Status: AC
  Administered 2015-11-02: 1 via ORAL

## 2015-11-02 MED ORDER — OXYCODONE-ACETAMINOPHEN 5-325 MG PO TABS: 1.0000 | ORAL_TABLET | Freq: Four times a day (QID) | ORAL | 0 refills | Status: AC | PRN

## 2015-11-02 MED ORDER — OXYCODONE-ACETAMINOPHEN 5-325 MG PO TABS
ORAL_TABLET | ORAL | Status: AC
  Filled 2015-11-02: qty 1

## 2015-11-02 NOTE — Discharge Instructions (Signed)
Please rest ice and elevate right lower extremity. Use crutches to ambulate.

## 2015-11-02 NOTE — ED Provider Notes (Signed)
[facility]-EMERGENCY DEPARTMENT Provider Note   CSN: [id number] Arrival date & time: 11/02/15  1929     History   Chief Complaint Chief Complaint  Patient presents with  . Foot Pain    HPI Deanna Marsh is a 50 y.o. female presents to the emergency department for evaluation of right heel pain. Patient states 3 days ago she fell going down a ladder,, approximately 6 foot landed on her right heel. [place] went [place] in [place] and had x-rays that showed a calcaneus fracture. Patient was placed into a splint, she is having increased pain, moderate to severe along the calcaneus. She was given prescription for Percocet, #5 tablets which did help with her pain. Patient has been taking ibuprofen 600 mg twice daily with no improvement. She is out of her pain medication. She is having increased moderate pain along the right calcaneus. Patient has been nonweightbearing with crutches. She does not have a follow-up appointment with podiatry. She denies any calf pain but is having some swelling throughout the foot and ankle. No chest pain or shortness of breath. No history of blood clots.  HPI  History reviewed. No pertinent past medical history.  Patient Active Problem List   Diagnosis Date Noted  . OTALGIA 01/24/2007  . ALLERGIC RHINITIS 01/24/2007    Past Surgical History:  Procedure Laterality Date  . FOOT ARTHROPLASTY    . TUBAL LIGATION    . tubes in ears      OB History    No data available       Home Medications    Prior to Admission medications   Medication Sig Start Date End Date Taking? Authorizing Provider  azithromycin (ZITHROMAX Z-PAK) 250 MG tablet Take 2 tablets (500 mg) on  Day 1,  followed by 1 tablet (250 mg) once daily on Days 2 through 5. 06/03/15   Evangeline Dakinharles M Beers, PA-C  cyclobenzaprine (FLEXERIL) 10 MG tablet Take 1 tablet (10 mg total) by mouth every 8 (eight) hours as needed for muscle spasms. 06/03/15   Charmayne Sheerharles M Beers, PA-C  guaiFENesin-codeine  100-10 MG/5ML syrup Take 10 mLs by mouth every 4 (four) hours as needed for cough. 06/03/15   Charmayne Sheerharles M Beers, PA-C  ibuprofen (ADVIL,MOTRIN) 800 MG tablet Take 1 tablet (800 mg total) by mouth every 8 (eight) hours as needed. 06/03/15   Evangeline Dakinharles M Beers, PA-C  oxyCODONE-acetaminophen (ROXICET) 5-325 MG tablet Take 1 tablet by mouth every 6 (six) hours as needed for severe pain. 11/02/15   Evon Slackhomas C Shaquille Murdy, PA-C    Family History History reviewed. No pertinent family history.  Social History Social History  Substance Use Topics  . Smoking status: Never Smoker  . Smokeless tobacco: Never Used  . Alcohol use No     Allergies   Povidone-iodine and Betadine [povidone iodine]   Review of Systems Review of Systems  Constitutional: Negative for activity change, chills, fatigue and fever.  HENT: Negative for congestion, sinus pressure and sore throat.   Eyes: Negative for visual disturbance.  Respiratory: Negative for cough, chest tightness and shortness of breath.   Cardiovascular: Negative for chest pain and leg swelling.  Gastrointestinal: Negative for abdominal pain, diarrhea, nausea and vomiting.  Genitourinary: Negative for dysuria.  Musculoskeletal: Positive for arthralgias, gait problem and joint swelling.  Skin: Negative for rash.  Neurological: Negative for weakness, numbness and headaches.  Hematological: Negative for adenopathy.  Psychiatric/Behavioral: Negative for agitation, behavioral problems and confusion.     Physical Exam Updated Vital  Signs BP 140/74 (BP Location: Right Arm)   Pulse 65   Temp 98.2 F (36.8 C) (Oral)   Resp 18   Ht 5\' 7"  (1.702 m)   Wt 59.9 kg   LMP 10/27/2015   SpO2 98%   BMI 20.67 kg/m   Physical Exam  Constitutional: She is oriented to person, place, and time. She appears well-developed and well-nourished. No distress.  HENT:  Head: Normocephalic and atraumatic.  Mouth/Throat: Oropharynx is clear and moist.  Eyes: EOM are normal.  Pupils are equal, round, and reactive to light. Right eye exhibits no discharge. Left eye exhibits no discharge.  Neck: Normal range of motion. Neck supple.  Cardiovascular: Normal rate, regular rhythm and intact distal pulses.   Pulmonary/Chest: Effort normal. No respiratory distress.  Musculoskeletal:  Examination of the right lower extremity shows patient has swelling throughout the ankle and calcaneus. She has a negative Homans sign. Ankle plantarflexion dorsiflexion is limited secondary to pain. She has bruising along the calcaneofibular ligament, calcaneus with no skin breakdown noted. She is nontender along the medial malleolus.  Neurological: She is alert and oriented to person, place, and time. She has normal reflexes.  Skin: Skin is warm and dry.  Psychiatric: She has a normal mood and affect. Her behavior is normal. Thought content normal.     ED Treatments / Results  Labs (all labs ordered are listed, but only abnormal results are displayed) Labs Reviewed - No data to display  EKG  EKG Interpretation None       Radiology Dg Foot Complete Right  Result Date: 11/02/2015 CLINICAL DATA:  Calcaneus pain after falling from both 3 days ago. Initial encounter. EXAM: RIGHT FOOT COMPLETE - 3+ VIEW COMPARISON:  None. FINDINGS: Calcaneal body fracture without measurable displacement. There could be subtalar extension. No calcaneocuboid joint involvement. Anticipate CT characterization. Bony bunion. IMPRESSION: Nondisplaced calcaneus fracture which may extend to the subtalar joint. Electronically Signed   By: Marnee Spring M.D.   On: 11/02/2015 21:03    Procedures Procedures (including critical care time) SPLINT APPLICATION Date/Time: 9:26 PM Authorized by: Patience Musca Consent: Verbal consent obtained. Risks and benefits: risks, benefits and alternatives were discussed Consent given by: patient Splint applied by: ED tech Location details: Right short leg  posterior splint  Splint type: Ortho-Glass Ace wrap  Supplies used: Ortho-Glass Ace wrap  Post-procedure: The splinted body part was neurovascularly unchanged following the procedure. Patient tolerance: Patient tolerated the procedure well with no immediate complications.     Medications Ordered in ED Medications  oxyCODONE-acetaminophen (PERCOCET/ROXICET) 5-325 MG per tablet (  Not Given 11/02/15 2019)  oxyCODONE-acetaminophen (PERCOCET/ROXICET) 5-325 MG per tablet 1 tablet (1 tablet Oral Given 11/02/15 2019)     Initial Impression / Assessment and Plan / ED Course  I have reviewed the triage vital signs and the nursing notes.  Pertinent labs & imaging results that were available during my care of the patient were reviewed by me and considered in my medical decision making (see chart for details).  Clinical Course    50 year old female with right calcaneus fracture that occurred 4 days ago. Splint was causing significant discomfort. After removal of the splint which was placed in [place], patient has significant improvement of pain. She is given refill of pain medication to take sparingly as needed for severe pain. She will continue with ibuprofen. New posterior splint was provided for patient, significant padding was placed around the calcaneus. She'll follow-up with podiatry. Continue nonweightbearing with crutches.  Final Clinical Impressions(s) / ED Diagnoses   Final diagnoses:  Calcaneus fracture, right, closed, initial encounter    New Prescriptions New Prescriptions   OXYCODONE-ACETAMINOPHEN (ROXICET) 5-325 MG TABLET    Take 1 tablet by mouth every 6 (six) hours as needed for severe pain.     Evon Slackhomas C Naven Giambalvo, PA-C 11/02/15 2120    Evon Slackhomas C Slyvia Lartigue, PA-C 11/02/15 2126    Sharyn CreamerMark Quale, MD 11/22/15 2129

## 2015-11-02 NOTE — ED Triage Notes (Addendum)
Pt to triage via wheelchair. Pt reports on Thursday afternoon she fell approx 8 feet off a boat landing on her right heel. Pt reports she went to an urgent care and they put her in a temporary cast. Reports they told her it is broken in two places.Pt has disc with her xray's.  Pt reports they instructed her to follow up with the ER when she returned home to have a full cast placed. Pt also reports having pain to the foot and has used up the 5 percocet they prescribed for her. + pulses and good sensation to foot. Movement restricted by splint. Pt has noticeable swelling to the foot but reports she has rode 12 hours straight from [place] today.

## 2015-11-02 NOTE — ED Notes (Signed)
Splint re-applied by Jan, ED tech; pt says heel feels so much better; pain free currently; PA Gaines in to followup before pt leaving;

## 2015-11-05 ENCOUNTER — Other Ambulatory Visit: Payer: Self-pay | Admitting: Podiatry

## 2015-11-05 ENCOUNTER — Ambulatory Visit
Admission: RE | Admit: 2015-11-05 | Discharge: 2015-11-05 | Disposition: A | Discharge status: Home / Self Care | Payer: BLUE CROSS/BLUE SHIELD | Source: Ambulatory Visit | Attending: Podiatry | Admitting: Podiatry

## 2015-11-05 DIAGNOSIS — S92014A Nondisplaced fracture of body of right calcaneus, initial encounter for closed fracture: Secondary | ICD-10-CM

## 2015-11-05 DIAGNOSIS — X58XXXA Exposure to other specified factors, initial encounter: Secondary | ICD-10-CM | POA: Insufficient documentation

## 2015-11-13 ENCOUNTER — Ambulatory Visit: Payer: BLUE CROSS/BLUE SHIELD

## 2016-05-07 ENCOUNTER — Emergency Department (HOSPITAL_COMMUNITY)
Admission: EM | Admit: 2016-05-07 | Discharge: 2016-05-07 | Disposition: A | Discharge status: Home / Self Care | Payer: BLUE CROSS/BLUE SHIELD | Attending: Emergency Medicine | Admitting: Emergency Medicine

## 2016-05-07 ENCOUNTER — Encounter (HOSPITAL_COMMUNITY): Payer: Self-pay

## 2016-05-07 DIAGNOSIS — J3089 Other allergic rhinitis: Secondary | ICD-10-CM

## 2016-05-07 DIAGNOSIS — Z79899 Other long term (current) drug therapy: Secondary | ICD-10-CM | POA: Insufficient documentation

## 2016-05-07 DIAGNOSIS — K0889 Other specified disorders of teeth and supporting structures: Secondary | ICD-10-CM | POA: Insufficient documentation

## 2016-05-07 DIAGNOSIS — J302 Other seasonal allergic rhinitis: Secondary | ICD-10-CM | POA: Insufficient documentation

## 2016-05-07 MED ORDER — AMOXICILLIN 500 MG PO CAPS: 500.0000 mg | ORAL_CAPSULE | Freq: Three times a day (TID) | ORAL | 0 refills | Status: AC

## 2016-05-07 MED ORDER — METHYLPREDNISOLONE 4 MG PO TBPK: ORAL_TABLET | ORAL | 0 refills | Status: AC

## 2016-05-07 NOTE — Discharge Instructions (Signed)
Please take antibiotics as prescribed. He may take the steroids as prescribed. Continue the Claritin and Flonase. I was just using over-the-counter nasal sprays that moisten your nose. You need to see a dentist.

## 2016-05-07 NOTE — ED Triage Notes (Signed)
Pt complains of right sided dental pain that is causing her right cheek to swell

## 2016-05-07 NOTE — ED Provider Notes (Signed)
[facility]-EMERGENCY DEPT Provider Note   CSN: [id number] Arrival date & time: 05/07/16  2229     History   Chief Complaint Chief Complaint  Patient presents with  . Dental Pain    HPI Deanna Marsh is a 51 y.o. female.  HPI 51 year old Caucasian female with past medical history significant for allergic rhinitis presents to the ED today with several complaints. First patient complains of right upper toothache that started this morning. States that is causing her right face to slightly swell. History of tooth abscesses with need for extraction in the past. Patient has not seen a dentist for this episode. She's been taking 800 ibuprofen at home with some relief. She denies any difficulty swallowing, fever, neck stiffness, redness, warmth to the right face. Secondly patient complains of allergic rhinitis. States took her dog to the that last night and had significant nasal congestion and puffy eyes when she left. Has been taking Flonase and Claritin without relief in her symptoms. States that her nose like it is on fire. States that she has been using the Flonase several times throughout the day. She endorses nasal rhinorrhea that is clear. Denies any eye swelling or vision changes. Patient denies any fever. Denies any difficulty breathing, shortness of breath, chest pain. History reviewed. No pertinent past medical history.  Patient Active Problem List   Diagnosis Date Noted  . OTALGIA 01/24/2007  . ALLERGIC RHINITIS 01/24/2007    Past Surgical History:  Procedure Laterality Date  . FOOT ARTHROPLASTY    . TUBAL LIGATION    . tubes in ears      OB History    No data available       Home Medications    Prior to Admission medications   Medication Sig Start Date End Date Taking? Authorizing Provider  amoxicillin (AMOXIL) 500 MG capsule Take 1 capsule (500 mg total) by mouth 3 (three) times daily. 05/07/16   Rise Mu, PA-C  azithromycin (ZITHROMAX Z-PAK) 250 MG tablet Take 2  tablets (500 mg) on  Day 1,  followed by 1 tablet (250 mg) once daily on Days 2 through 5. 06/03/15   Evangeline Dakin, PA-C  cyclobenzaprine (FLEXERIL) 10 MG tablet Take 1 tablet (10 mg total) by mouth every 8 (eight) hours as needed for muscle spasms. 06/03/15   Charmayne Sheer Beers, PA-C  guaiFENesin-codeine 100-10 MG/5ML syrup Take 10 mLs by mouth every 4 (four) hours as needed for cough. 06/03/15   Charmayne Sheer Beers, PA-C  ibuprofen (ADVIL,MOTRIN) 800 MG tablet Take 1 tablet (800 mg total) by mouth every 8 (eight) hours as needed. 06/03/15   Evangeline Dakin, PA-C  methylPREDNISolone (MEDROL DOSEPAK) 4 MG TBPK tablet Take as prescribed 05/07/16   Rise Mu, PA-C  oxyCODONE-acetaminophen (ROXICET) 5-325 MG tablet Take 1 tablet by mouth every 6 (six) hours as needed for severe pain. 11/02/15   Evon Slack, PA-C    Family History History reviewed. No pertinent family history.  Social History Social History  Substance Use Topics  . Smoking status: Never Smoker  . Smokeless tobacco: Never Used  . Alcohol use No     Allergies   Povidone-iodine and Betadine [povidone iodine]   Review of Systems Review of Systems  Constitutional: Negative for chills and fever.  HENT: Positive for congestion, dental problem, postnasal drip, rhinorrhea and sneezing. Negative for sore throat.   Eyes: Negative for pain, itching and visual disturbance.  Respiratory: Negative for cough, shortness of breath and wheezing.  Cardiovascular: Negative for chest pain and palpitations.  Skin: Negative for color change.  Neurological: Negative for headaches.  All other systems reviewed and are negative.    Physical Exam Updated Vital Signs BP 150/100 (BP Location: Right Arm)   Pulse 88   Temp 97.9 F (36.6 C) (Oral)   Resp 20   Ht 5\' 7"  (1.702 m)   Wt 59.4 kg   LMP 04/11/2016   SpO2 100%   BMI 20.52 kg/m   Physical Exam  Constitutional: She appears well-developed and well-nourished. No distress.    The patient is nontoxic appearing. She is managing her secretions and maintaining her airway. Talking in complete sentences.  HENT:  Head: Normocephalic and atraumatic.  Right Ear: Tympanic membrane, external ear and ear canal normal.  Left Ear: Tympanic membrane, external ear and ear canal normal.  Nose: Mucosal edema, rhinorrhea and sinus tenderness present. No nasal septal hematoma.  Mouth/Throat: Uvula is midline, oropharynx is clear and moist and mucous membranes are normal. No trismus in the jaw. Abnormal dentition. Dental caries present. No dental abscesses. No oropharyngeal exudate, posterior oropharyngeal edema or posterior oropharyngeal erythema.    Oropharynx is clear. No trismus.Several missing teeth.  I do not appreciate any facial swelling. No erythema. No gross abscess. No sublingual or submandibular swelling.  Eyes: Conjunctivae and EOM are normal. Pupils are equal, round, and reactive to light. Right eye exhibits no discharge. Left eye exhibits no discharge. No scleral icterus.  Neck: Normal range of motion. Neck supple.  Pulmonary/Chest: No respiratory distress.  Musculoskeletal: Normal range of motion.  Lymphadenopathy:    She has no cervical adenopathy.  Neurological: She is alert.  Skin: Skin is warm and dry. Capillary refill takes less than 2 seconds. No pallor.  Nursing note and vitals reviewed.    ED Treatments / Results  Labs (all labs ordered are listed, but only abnormal results are displayed) Labs Reviewed - No data to display  EKG  EKG Interpretation None       Radiology No results found.  Procedures Procedures (including critical care time)  Medications Ordered in ED Medications - No data to display   Initial Impression / Assessment and Plan / ED Course  I have reviewed the triage vital signs and the nursing notes.  Pertinent labs & imaging results that were available during my care of the patient were reviewed by me and considered in my  medical decision making (see chart for details).     Patient with toothache.  No gross abscess.  Exam unconcerning for Ludwig's angina or spread of infection.  Will treat with amoxicillins, steroids and pain medicine.  Urged patient to follow-up with dentist. Patient complains of allergic rhinitis. Has been using Flonase and Claritin. Significant edema and rhinorrhea to the nasal turbinates. Appear dry and irritated. Likely due to Flonase use. Encourage patient to use saline nasal sprays at home. Follow-up with an allergist or primary care doctor. Return to ED if she has any difficulties breathing. She is currently hemodynamically stable. Vital signs are normal. Oropharynx is clear patient is managing her secretions and maintaining airway.    Final Clinical Impressions(s) / ED Diagnoses   Final diagnoses:  Pain, dental  Environmental and seasonal allergies    New Prescriptions New Prescriptions   AMOXICILLIN (AMOXIL) 500 MG CAPSULE    Take 1 capsule (500 mg total) by mouth 3 (three) times daily.   METHYLPREDNISOLONE (MEDROL DOSEPAK) 4 MG TBPK TABLET    Take as prescribed  Rise MuKenneth T Leaphart, PA-C 05/07/16 2343    Melene Planan [place], DO 05/08/16 [phone]

## 2016-07-18 ENCOUNTER — Encounter (HOSPITAL_COMMUNITY): Payer: Self-pay | Admitting: Nurse Practitioner

## 2016-07-18 ENCOUNTER — Emergency Department (HOSPITAL_COMMUNITY): Payer: Self-pay

## 2016-07-18 ENCOUNTER — Emergency Department (HOSPITAL_COMMUNITY)
Admission: EM | Admit: 2016-07-18 | Discharge: 2016-07-19 | Disposition: A | Discharge status: Home / Self Care | Payer: Self-pay | Attending: Emergency Medicine | Admitting: Emergency Medicine

## 2016-07-18 DIAGNOSIS — Y939 Activity, unspecified: Secondary | ICD-10-CM | POA: Insufficient documentation

## 2016-07-18 DIAGNOSIS — Z79899 Other long term (current) drug therapy: Secondary | ICD-10-CM | POA: Insufficient documentation

## 2016-07-18 DIAGNOSIS — Z23 Encounter for immunization: Secondary | ICD-10-CM | POA: Insufficient documentation

## 2016-07-18 DIAGNOSIS — Z96698 Presence of other orthopedic joint implants: Secondary | ICD-10-CM | POA: Insufficient documentation

## 2016-07-18 DIAGNOSIS — Y999 Unspecified external cause status: Secondary | ICD-10-CM | POA: Insufficient documentation

## 2016-07-18 DIAGNOSIS — W292XXA Contact with other powered household machinery, initial encounter: Secondary | ICD-10-CM | POA: Insufficient documentation

## 2016-07-18 DIAGNOSIS — Y929 Unspecified place or not applicable: Secondary | ICD-10-CM | POA: Insufficient documentation

## 2016-07-18 DIAGNOSIS — S60041A Contusion of right ring finger without damage to nail, initial encounter: Secondary | ICD-10-CM

## 2016-07-18 DIAGNOSIS — S61214A Laceration without foreign body of right ring finger without damage to nail, initial encounter: Secondary | ICD-10-CM | POA: Insufficient documentation

## 2016-07-18 MED ORDER — TETANUS-DIPHTH-ACELL PERTUSSIS 5-2.5-18.5 LF-MCG/0.5 IM SUSP
0.5000 mL | Freq: Once | INTRAMUSCULAR | Status: AC
  Administered 2016-07-18: 0.5 mL via INTRAMUSCULAR
  Filled 2016-07-18: qty 0.5

## 2016-07-18 MED ORDER — ONDANSETRON 4 MG PO TBDP
4.0000 mg | ORAL_TABLET | Freq: Once | ORAL | Status: AC
  Administered 2016-07-18: 4 mg via ORAL
  Filled 2016-07-18: qty 1

## 2016-07-18 NOTE — ED Provider Notes (Signed)
[facility]-EMERGENCY DEPT Provider Note   CSN: [id number] Arrival date & time: 07/18/16  2033     History   Chief Complaint No chief complaint on file.   HPI Deanna Marsh is a 51 y.o. female with no past medical hx presenting after sustaining an injury to her right ring finger. She explains that she was working on her dryer when she cut her left hand and felt her right hand catch between the washer and dryer, but didn't notice anything as she was reaching for the bathroom to stop the bleeding. She then noticed bruising and significant swelling. She has not been able to move her finger. She has tried ice and ibuprofen with relief. She denies other injuries. She has had associated nausea. Tetanus status unknown.  HPI  History reviewed. No pertinent past medical history.  Patient Active Problem List   Diagnosis Date Noted  . OTALGIA 01/24/2007  . ALLERGIC RHINITIS 01/24/2007    Past Surgical History:  Procedure Laterality Date  . FOOT ARTHROPLASTY    . TUBAL LIGATION    . tubes in ears      OB History    No data available       Home Medications    Prior to Admission medications   Medication Sig Start Date End Date Taking? Authorizing Provider  amoxicillin (AMOXIL) 500 MG capsule Take 1 capsule (500 mg total) by mouth 3 (three) times daily. 05/07/16   Rise MuLeaphart, Kenneth T, PA-C  azithromycin (ZITHROMAX Z-PAK) 250 MG tablet Take 2 tablets (500 mg) on  Day 1,  followed by 1 tablet (250 mg) once daily on Days 2 through 5. 06/03/15   Beers, Charmayne Sheerharles M, PA-C  cyclobenzaprine (FLEXERIL) 10 MG tablet Take 1 tablet (10 mg total) by mouth every 8 (eight) hours as needed for muscle spasms. 06/03/15   Beers, Charmayne Sheerharles M, PA-C  guaiFENesin-codeine 100-10 MG/5ML syrup Take 10 mLs by mouth every 4 (four) hours as needed for cough. 06/03/15   Beers, Charmayne Sheerharles M, PA-C  ibuprofen (ADVIL,MOTRIN) 800 MG tablet Take 1 tablet (800 mg total) by mouth every 8 (eight) hours as needed. 06/03/15   Beers, Charmayne Sheerharles M,  PA-C  methylPREDNISolone (MEDROL DOSEPAK) 4 MG TBPK tablet Take as prescribed 05/07/16   Demetrios LollLeaphart, Kenneth T, PA-C  oxyCODONE-acetaminophen (ROXICET) 5-325 MG tablet Take 1 tablet by mouth every 6 (six) hours as needed for severe pain. 11/02/15   Evon SlackGaines, Thomas C, PA-C    Family History History reviewed. No pertinent family history.  Social History Social History  Substance Use Topics  . Smoking status: Never Smoker  . Smokeless tobacco: Never Used  . Alcohol use No     Allergies   Povidone-iodine and Betadine [povidone iodine]   Review of Systems Review of Systems  Respiratory: Negative for shortness of breath.   Gastrointestinal: Positive for nausea.  Musculoskeletal: Positive for arthralgias.  Skin: Positive for color change and wound.  Neurological: Positive for numbness.     Physical Exam Updated Vital Signs BP (!) 137/93 (BP Location: Left Arm)   Pulse 75   Temp 98 F (36.7 C) (Oral)   Resp 16   Ht 5\' 7"  (1.702 m)   Wt 59 kg   LMP 07/09/2016   SpO2 100%   BMI 20.36 kg/m   Physical Exam  Constitutional: She appears well-developed and well-nourished. No distress.  Afebrile, non-toxic appearing, sitting in chair in no acute distress.  HENT:  Head: Normocephalic and atraumatic.  Eyes: Conjunctivae are normal.  Neck: Neck  supple.  Cardiovascular: Normal rate, regular rhythm, normal heart sounds and intact distal pulses.   No murmur heard. Pulmonary/Chest: Effort normal and breath sounds normal. No respiratory distress. She has no wheezes. She has no rales.  Abdominal: She exhibits no distension. There is tenderness.  Musculoskeletal: She exhibits edema and tenderness. She exhibits no deformity.  Unable to bend her finger at DIP, PIP, palmar aspect with ecchymosis  Neurological: She is alert. A sensory deficit is present.  Skin: Skin is warm and dry. She is not diaphoretic.  Psychiatric: She has a normal mood and affect.  Nursing note and vitals  reviewed.    ED Treatments / Results  Labs (all labs ordered are listed, but only abnormal results are displayed) Labs Reviewed - No data to display  EKG  EKG Interpretation None       Radiology Dg Hand 2 View Right  Result Date: 07/18/2016 CLINICAL DATA:  Laceration to the right ring finger today. Finger is swollen and blue in color. EXAM: RIGHT HAND - 2 VIEW COMPARISON:  None. FINDINGS: No evidence of acute fracture or dislocation of the right fourth finger. No radiopaque soft tissue foreign bodies. No focal bone lesion or bone destruction. Bone cortex appears intact. IMPRESSION: Negative. Electronically Signed   By: Burman Nieves M.D.   On: 07/18/2016 23:03    Procedures Procedures (including critical care time) SPLINT APPLICATION Date/Time: 1:32 AM Authorized by: Georgiana Shore Consent: Verbal consent obtained. Risks and benefits: risks, benefits and alternatives were discussed Consent given by: patient Splint applied by: orthopedic technician Location details: right ring finger Splint type: finger static Supplies used: fingerstatic Post-procedure: The splinted body part was neurovascularly unchanged following the procedure. Patient tolerance: Patient tolerated the procedure well with no immediate complications.    Medications Ordered in ED Medications  ondansetron (ZOFRAN-ODT) disintegrating tablet 4 mg (4 mg Oral Given 07/18/16 2234)  Tdap (BOOSTRIX) injection 0.5 mL (0.5 mLs Intramuscular Given 07/18/16 2336)     Initial Impression / Assessment and Plan / ED Course  I have reviewed the triage vital signs and the nursing notes.  Pertinent labs & imaging results that were available during my care of the patient were reviewed by me and considered in my medical decision making (see chart for details).    Patient presents with injury to right ring finger. Full passive rom, swelling has subsided, ecchymosis. Films negative for fracture or dislocation.  She was  able to bend at DIP against resistance, but wouldn't bend at PIP due to pain.  Finger was splinted in ED. Pain and nausea were managed. Patient declined pain medications.  Discharge home with RICE protocol and symptomatic management and close follow up with hand in the morning. Patient was discussed with Dr. Denton Lank who agrees with assessment and plan.  tetanus updated while in Ed. Patient sustained superficial laceration of other hand on metal behind her dryer. Wound was clean and bleeding controlled PTA.  Discussed strict return precautions and advised to return to the emergency department if experiencing any new or worsening symptoms. Instructions were understood and patient agreed with discharge plan.  Final Clinical Impressions(s) / ED Diagnoses   Final diagnoses:  Contusion of right ring finger without damage to nail, initial encounter    New Prescriptions Discharge Medication List as of 07/19/2016 12:51 AM       Georgiana Shore, PA-C 07/19/16 0133    Cathren Laine, MD 07/26/16 [phone]

## 2016-07-18 NOTE — ED Triage Notes (Signed)
Pt was working behind her dryer and she cut her hand and tried to climb back over it and somehow injured her ring finger.  Finger swollen and blue in color. Pt unable to move that finger.  Has held ice on it for the past hour.  A&O x4.  Ambulatory.

## 2016-07-18 NOTE — ED Notes (Signed)
Pt can't remember the last time she had a tetanus shot.

## 2016-07-19 NOTE — Discharge Instructions (Signed)
As discussed, follow the RICE protocol provided and follow up with the hand specialist is the morning.  Return to the ER if symptoms worsen in the meantime.
